# Patient Record
Sex: Male | Born: 1985 | Race: Black or African American | Hispanic: No | Marital: Married | Smoking: Never smoker
Health system: Southern US, Community
[De-identification: ages and names within clinical notes are randomized; demographics above are authoritative.]

## PROBLEM LIST (undated history)

## (undated) DIAGNOSIS — G43909 Migraine, unspecified, not intractable, without status migrainosus: Secondary | ICD-10-CM

## (undated) DIAGNOSIS — L739 Follicular disorder, unspecified: Secondary | ICD-10-CM

---
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
## 2007-05-15 ENCOUNTER — Emergency Department (HOSPITAL_COMMUNITY): Admission: EM | Admit: 2007-05-15 | Discharge: 2007-05-15 | Payer: Self-pay | Admitting: Emergency Medicine

## 2010-08-31 NOTE — H&P (Signed)
NAME:  Gerald Savage, Gerald Savage NO.:  [id number]   MEDICAL RECORD NO.:  [id number]                   PATIENT TYPE:  INP   LOCATION:  0451                                 FACILITY:  [facility]   PHYSICIAN:  Hannah Beat, M.D.                   DATE OF BIRTH:  Jun 01, 1985   DATE OF ADMISSION:  11/10/2002  DATE OF DISCHARGE:                                HISTORY & PHYSICAL   PRIMARY CARE PHYSICIAN:  Sharlot Gowda, M.D.   CHIEF COMPLAINT:  Weakness and shortness of breath.   HISTORY OF PRESENTING ILLNESS:  This is a 25 year old athletic male without  significant medical history who presented to the emergency department after  experiencing sudden shortness of breath while running during football  practice at approximately 8:30 p.m. this evening.  He had been lifting  weights around 5:30, and subsequently concluded football practice today with  a 400 meter run, at which time he states he felt as though he could not  catch his breath.  He felt like he had a cough and had a sharp focal pain in  his left parasternal region that was sharp.  It was nonradiating and lasted  for 30 minutes in duration and completely resolved.  He was found to be  orthostatic in the emergency department without any risk factors identified  for pulmonary emboli.  He denied any change in his general health; in fact  he had a football physical two days ago and said that everything was fine.  He has been active all his life in sports, and has never had any problems  such as this.  Denies any recent illnesses.  Denies increased urination.  No  complaints of joint pains.  No behavioral complaints.  His family states  that this is a single isolated episode and never happened like this before.  He was seen in the emergency department at [facility] completely pain free  and asymptomatic after receiving IV fluids.   PAST MEDICAL HISTORY:  As noted above is unremarkable.   SURGICAL HISTORY:  There is  none.   FAMILY HISTORY:  The patient's father is 54, alive and well, without medical  problems.  Mother is 72 with type 2 diabetes.  A history of grandparents  with coronary artery disease on his mother's side.  There are no reports of  sudden death or early heart disease, to the knowledge of the patient's  father.   SOCIAL HISTORY:  The patient plays football in school, which he has all his  life.  Denies tobacco.  Denies IV drugs, IV steroids, crack cocaine.  He  only takes B-12 supplements.   MEDICATION LIST:  B-12 supplements.   ALLERGIES:  No known drug allergies.   REVIEW OF SYSTEMS:  GENERAL:  The patient has been quite well.  No weight  loss.  His appetite is good.  He denies nausea, vomiting, diarrhea.  HEENT:  With this presenting illness, the patient stated he had some eye pain which  has resolved at present.  No visual disturbance, no auditory disturbance, no  hemoptysis or productive cough, no pharyngitis.  He denied neck pain.  CARDIOVASCULAR:  He maintains a regular exercise regimen.  Runs pretty  regularly.  Lifts weights.  He denies decrease in exercise tolerance up  until this episode today.  PULMONARY:  Denies cough, denies hemoptysis.  He  reports that he had some wheezing with his history of presenting illness.  GI:  No nausea, vomiting, or diarrhea as noted above.  No hematochezia, no  melena.  GU:  No dysuria, no polyuria.  ID:  The patient denies any recent  travel.  No recent upper respiratory infections.  No abscesses or dental  caries.  No known areas of infection.  MUSCULOSKELETAL:  The patient denies  joint pains, joint swelling.  Denies morning stiffness.  He denies rash.  NEUROLOGIC:  There are no behavioral problems, no change in his mental  status according to his father.   PHYSICAL EXAMINATION:  VITAL SIGNS:  The patient's blood pressure is 120/61,  heart rate 104, temperature 99.1, respirations 20.  GENERAL:  The patient is awake and alert, in no  acute distress.  He is  comfortable.  The patient exhibits no morphinoid features.  HEENT:  Normocephalic and atraumatic.  Extraocular muscles intact.  Pupils  equal, round and reactive to light and accommodation.  Throat is clear.  NECK:  Supple, nontender.  CARDIOVASCULAR:  S1 and S2 without murmur or S3.  There was no jugular  venous distention, no carotid bruits to auscultation.  His peripheral pulses  including radial, femoral, dorsalis pedis pulse, and popliteal are  symmetrical bilaterally.  PULMONARY:  Clear to auscultation bilaterally.  No wheeze noted.  Symmetrical excursion bilaterally.  No dullness to percussion over the lung  fields.  ABDOMEN:  Soft and nontender without organomegaly.  No palpable tenderness.  No rigidity.  Bowel sounds are normoactive.  EXTREMITIES:  No clubbing, cyanosis, or edema.  No splinter hemorrhages.  His strength is +5/5 bilaterally with respect to his biceps, triceps, hip  flexors and extensors.  Deep tendon reflexes are +2/4 with respect to the  patella, brachial radialis, and biceps.   LABORATORY DATA:  The patient had a WBC of 16.8, hemoglobin 16.1, hematocrit  45, platelets 259.  Sodium 138, potassium 3.9, chloride 104, CO2 25, BUN 10,  creatinine 1.8, glucose 96, calcium 11.0.  AST and ALT 31 and 13, alkaline  phosphatase 89, bilirubin 1.1.  Urine drug screen is negative.  EKG reveals  sinus tachycardia with no evidence of ST segment elevation or Q waves.  There was early repolarization.  Chest x-ray was reported by Dr. Lynelle Doctor, as  well as the radiologist, as no active disease.   IMPRESSION AND PLAN:  1. Orthostasis/dehydration.  This fits the clinical picture with the     patient's activities as reported in the history of present illness.     Currently asymptomatic.  He is being volume repleted with lytes.  We are     going to repeat his Chem-7 in 12 hours to observe for resolution of his    creatinine.  We will check ionized calcium to  due to the calcium being     mildly elevated at 11.0, possibly due to the patient's volume depletion.     We will continue to administer IV fluids.  Secondary to the orthostasis  and its acute nature, we will check an echocardiogram to rule out any     valvular abnormalities.  2. Elevated creatinine.  We are going to check a urine sodium.  Will check     his FENA and continue hydration, and repeat this lab in the morning.  I     felt that this may be secondary to his volume depletion and possibly     secondary     to recent strenuous activities.  3. Elevated WBC.  We will repeat this in the a.m., and as the patient has a     low-grade temperature, cultures had been obtained in the emergency     department.  Will await these result, and will follow him clinically.                                               Hannah Beat, M.D.    ES/MEDQ  D:  11/11/2002  T:  11/11/2002  Job:  [id number]   cc:   Sharlot Gowda, M.D.  [address] [place], [place] [address]  Fax: [phone]

## 2011-06-20 ENCOUNTER — Encounter: Payer: Self-pay | Admitting: Internal Medicine

## 2011-06-21 ENCOUNTER — Emergency Department (HOSPITAL_COMMUNITY): Payer: 59

## 2011-06-21 ENCOUNTER — Encounter (HOSPITAL_COMMUNITY): Payer: Self-pay | Admitting: Emergency Medicine

## 2011-06-21 ENCOUNTER — Emergency Department (HOSPITAL_COMMUNITY)
Admission: EM | Admit: 2011-06-21 | Discharge: 2011-06-22 | Disposition: A | Discharge status: Home / Self Care | Payer: 59 | Attending: Emergency Medicine | Admitting: Emergency Medicine

## 2011-06-21 DIAGNOSIS — B349 Viral infection, unspecified: Secondary | ICD-10-CM

## 2011-06-21 DIAGNOSIS — R05 Cough: Secondary | ICD-10-CM | POA: Insufficient documentation

## 2011-06-21 DIAGNOSIS — R059 Cough, unspecified: Secondary | ICD-10-CM | POA: Insufficient documentation

## 2011-06-21 DIAGNOSIS — R509 Fever, unspecified: Secondary | ICD-10-CM | POA: Insufficient documentation

## 2011-06-21 DIAGNOSIS — B9789 Other viral agents as the cause of diseases classified elsewhere: Secondary | ICD-10-CM | POA: Insufficient documentation

## 2011-06-21 DIAGNOSIS — R110 Nausea: Secondary | ICD-10-CM | POA: Insufficient documentation

## 2011-06-21 HISTORY — DX: Follicular disorder, unspecified: L73.9

## 2011-06-21 HISTORY — DX: Migraine, unspecified, not intractable, without status migrainosus: G43.909

## 2011-06-21 MED ORDER — ACETAMINOPHEN 325 MG PO TABS
650.0000 mg | ORAL_TABLET | Freq: Once | ORAL | Status: AC
  Administered 2011-06-21: 650 mg via ORAL
  Filled 2011-06-21: qty 2

## 2011-06-21 NOTE — ED Provider Notes (Signed)
History     CSN: [id number]  Arrival date & time 06/21/11  2014   First MD Initiated Contact with Patient 06/21/11 2244      Chief Complaint  Patient presents with  . Fever    (Consider location/radiation/quality/duration/timing/severity/associated sxs/prior treatment) Patient is a 26 y.o. male presenting with fever. The history is provided by the patient. No language interpreter was used.  Fever Primary symptoms of the febrile illness include fever, cough and nausea. Primary symptoms do not include fatigue, visual change, headaches, wheezing, shortness of breath, abdominal pain, vomiting, diarrhea or dysuria. The problem has been resolved. Primary symptoms comment: sore throat   Upper respiratory symptoms with fever x 24 hours.  Headache, Cough with congestion, sore throat and body aches. No neck pain.    Past Medical History  Diagnosis Date  . Folliculitis   . Migraines     History reviewed. No pertinent past surgical history.  Family History  Problem Relation Age of Onset  . Diabetes Other     History  Substance Use Topics  . Smoking status: Never Smoker   . Smokeless tobacco: Not on file  . Alcohol Use: Yes     rare      Review of Systems  Constitutional: Positive for fever. Negative for fatigue.  Respiratory: Positive for cough. Negative for shortness of breath and wheezing.   Gastrointestinal: Positive for nausea. Negative for vomiting, abdominal pain and diarrhea.  Genitourinary: Negative for dysuria.  Neurological: Negative for headaches.  All other systems reviewed and are negative.    Allergies  Review of patient's allergies indicates no known allergies.  Home Medications   Current Outpatient Rx  Name Route Sig Dispense Refill  . DM-GUAIFENESIN ER 30-600 MG PO TB12 Oral Take 1 tablet by mouth every 12 (twelve) hours.    . IBUPROFEN 200 MG PO TABS Oral Take 200 mg by mouth every 6 (six) hours as needed. For pain relief    . ISOTRETINOIN 40 MG PO  CAPS Oral Take 40 mg by mouth 2 (two) times daily.      BP 140/83  Pulse 116  Temp(Src) 100.2 F (37.9 C) (Oral)  Resp 18  SpO2 97%  Physical Exam  Nursing note and vitals reviewed. Constitutional: He is oriented to person, place, and time. He appears well-developed and well-nourished.  HENT:  Head: Normocephalic and atraumatic.  Eyes: Pupils are equal, round, and reactive to light.  Neck: Normal range of motion. Neck supple.  Cardiovascular: Normal rate and regular rhythm.   Pulmonary/Chest: Effort normal and breath sounds normal. No respiratory distress.  Abdominal: Soft. He exhibits no distension.  Musculoskeletal: Normal range of motion.  Neurological: He is alert and oriented to person, place, and time. No cranial nerve deficit.  Skin: Skin is warm and dry.  Psychiatric: He has a normal mood and affect.    ED Course  Procedures (including critical care time)  Labs Reviewed - No data to display No results found.   No diagnosis found.    MDM  Fever, cough, sore throat, headache and body aches.  Fever 100.2 after tylenol. No neck pain, dysuria or vomiting.  Nauseated with headache earlier has resolved.  Chest x-ray and strep negative.  Will return if worse with neck pain. Tylenol or motrin for fever.  Zofran for nausea.  pmh of migraines and folliculitis.       Jethro Bastos, NP 06/22/11 1141

## 2011-06-21 NOTE — ED Notes (Signed)
Pt states he has had a headache all night, congestion in his chest, chills, vomiting, sore throat, cough, body aches  Sxs started last night

## 2011-06-22 MED ORDER — IBUPROFEN 800 MG PO TABS
800.0000 mg | ORAL_TABLET | Freq: Once | ORAL | Status: AC
  Administered 2011-06-22: 800 mg via ORAL
  Filled 2011-06-22: qty 1

## 2011-06-22 NOTE — ED Provider Notes (Signed)
Medical screening examination/treatment/procedure(s) were performed by non-physician practitioner and as supervising physician I was immediately available for consultation/collaboration.   Isay Perleberg L Davionne Dowty, MD 06/22/11 2234 

## 2011-06-22 NOTE — Discharge Instructions (Signed)
Gerald Savage you do not have strep throat or pneumonia as a source of your fever.  I think you are experiencing a febrile virus.  If you get worse with neck pain or burning with urination return to the ER.  Use the zofran for nausea if you need to.  Take tylenol every 4 hours for fever.  You can also take ibuprofen for fever if you wish.     Antibiotic Nonuse  Your caregiver felt that the infection or problem was not one that would be helped with an antibiotic. Infections may be caused by viruses or bacteria. Only a caregiver can tell which one of these is the likely cause of an illness. A cold is the most common cause of infection in both adults and children. A cold is a virus. Antibiotic treatment will have no effect on a viral infection. Viruses can lead to many lost days of work caring for sick children and many missed days of school. Children may catch as many as 10 "colds" or "flus" per year during which they can be tearful, cranky, and uncomfortable. The goal of treating a virus is aimed at keeping the ill person comfortable. Antibiotics are medications used to help the body fight bacterial infections. There are relatively few types of bacteria that cause infections but there are hundreds of viruses. While both viruses and bacteria cause infection they are very different types of germs. A viral infection will typically go away by itself within 7 to 10 days. Bacterial infections may spread or get worse without antibiotic treatment. Examples of bacterial infections are:  Sore throats (like strep throat or tonsillitis).   Infection in the lung (pneumonia).   Ear and skin infections.  Examples of viral infections are:  Colds or flus.   Most coughs and bronchitis.   Sore throats not caused by Strep.   Runny noses.  It is often best not to take an antibiotic when a viral infection is the cause of the problem. Antibiotics can kill off the helpful bacteria that we have inside our body and allow  harmful bacteria to start growing. Antibiotics can cause side effects such as allergies, nausea, and diarrhea without helping to improve the symptoms of the viral infection. Additionally, repeated uses of antibiotics can cause bacteria inside of our body to become resistant. That resistance can be passed onto harmful bacterial. The next time you have an infection it may be harder to treat if antibiotics are used when they are not needed. Not treating with antibiotics allows our own immune system to develop and take care of infections more efficiently. Also, antibiotics will work better for [place] when they are prescribed for bacterial infections. Treatments for a child that is ill may include:  Give extra fluids throughout the day to stay hydrated.   Get plenty of rest.   Only give your child over-the-counter or prescription medicines for pain, discomfort, or fever as directed by your caregiver.   The use of a cool mist humidifier may help stuffy noses.   Cold medications if suggested by your caregiver.  Your caregiver may decide to start you on an antibiotic if:  The problem you were seen for today continues for a longer length of time than expected.   You develop a secondary bacterial infection.  SEEK MEDICAL CARE IF:  Fever lasts longer than 5 days.   Symptoms continue to get worse after 5 to 7 days or become severe.   Difficulty in breathing develops.   Signs  of dehydration develop (poor drinking, rare urinating, dark colored urine).   Changes in behavior or worsening tiredness (listlessness or lethargy).  Document Released: 06/10/2001 Document Revised: 03/21/2011 Document Reviewed: 12/07/2008 [facility] Patient Information 2012 [place], [place].Cough, Adult  A cough is a reflex. It helps you clear your throat and airways. A cough can help heal your body. A cough can last 2 or 3 weeks (acute) or may last more than 8 weeks (chronic). Some common causes of a cough can include an infection,  allergy, or a cold. HOME CARE  Only take medicine as told by your doctor.   If given, take your medicines (antibiotics) as told. Finish them even if you start to feel better.   Use a cold steam vaporizer or humidier in your home. This can help loosen thick spit (secretions).   Sleep so you are almost sitting up (semi-upright). Use pillows to do this. This helps reduce coughing.   Rest as needed.   Stop smoking if you smoke.  GET HELP RIGHT AWAY IF:  You have yellowish-white fluid (pus) in your thick spit.   Your cough gets worse.   Your medicine does not reduce coughing, and you are losing sleep.   You cough up blood.   You have trouble breathing.   Your pain gets worse and medicine does not help.   You have a fever.  MAKE SURE YOU:   Understand these instructions.   Will watch your condition.   Will get help right away if you are not doing well or get worse.  Document Released: 12/13/2010 Document Revised: 03/21/2011 Document Reviewed: 12/13/2010 [facility] Patient Information 2012 [place], [place].Cough, Adult  A cough is a reflex. It helps you clear your throat and airways. A cough can help heal your body. A cough can last 2 or 3 weeks (acute) or may last more than 8 weeks (chronic). Some common causes of a cough can include an infection, allergy, or a cold. HOME CARE  Only take medicine as told by your doctor.   If given, take your medicines (antibiotics) as told. Finish them even if you start to feel better.   Use a cold steam vaporizer or humidier in your home. This can help loosen thick spit (secretions).   Sleep so you are almost sitting up (semi-upright). Use pillows to do this. This helps reduce coughing.   Rest as needed.   Stop smoking if you smoke.  GET HELP RIGHT AWAY IF:  You have yellowish-white fluid (pus) in your thick spit.   Your cough gets worse.   Your medicine does not reduce coughing, and you are losing sleep.   You cough up blood.    You have trouble breathing.   Your pain gets worse and medicine does not help.   You have a fever.  MAKE SURE YOU:   Understand these instructions.   Will watch your condition.   Will get help right away if you are not doing well or get worse.  Document Released: 12/13/2010 Document Revised: 03/21/2011 Document Reviewed: 12/13/2010 [facility] Patient Information 2012 [place], [place].

## 2011-06-26 ENCOUNTER — Encounter: Payer: Self-pay | Admitting: Family Medicine

## 2011-06-26 ENCOUNTER — Ambulatory Visit (INDEPENDENT_AMBULATORY_CARE_PROVIDER_SITE_OTHER): Payer: 59 | Admitting: Family Medicine

## 2011-06-26 VITALS — BP 132/90 | HR 78 | Ht 74.0 in | Wt 331.0 lb

## 2011-06-26 DIAGNOSIS — Z0000 Encounter for general adult medical examination without abnormal findings: Secondary | ICD-10-CM

## 2011-06-26 DIAGNOSIS — B350 Tinea barbae and tinea capitis: Secondary | ICD-10-CM

## 2011-06-26 DIAGNOSIS — G44009 Cluster headache syndrome, unspecified, not intractable: Secondary | ICD-10-CM | POA: Insufficient documentation

## 2011-06-26 DIAGNOSIS — L738 Other specified follicular disorders: Secondary | ICD-10-CM

## 2011-06-26 LAB — POCT URINALYSIS DIPSTICK
Blood, UA: NEGATIVE
Ketones, UA: NEGATIVE
Spec Grav, UA: 1.015
Urobilinogen, UA: NEGATIVE
pH, UA: 8

## 2011-06-26 LAB — COMPREHENSIVE METABOLIC PANEL
ALT: 18 U/L (ref 0–53)
AST: 23 U/L (ref 0–37)
BUN: 10 mg/dL (ref 6–23)
CO2: 27 mEq/L (ref 19–32)
Calcium: 9.8 mg/dL (ref 8.4–10.5)
Chloride: 103 mEq/L (ref 96–112)
Creat: 0.8 mg/dL (ref 0.50–1.35)
Total Bilirubin: 0.5 mg/dL (ref 0.3–1.2)

## 2011-06-26 LAB — LIPID PANEL
Cholesterol: 128 mg/dL (ref 0–200)
HDL: 41 mg/dL (ref >39)
Total CHOL/HDL Ratio: 3.1 Ratio
VLDL: 11 mg/dL (ref 0–40)

## 2011-06-26 NOTE — Progress Notes (Signed)
Subjective:    Patient ID: Gerald Savage, male    DOB: 1986-03-25, 26 y.o.   MRN: [id number]  HPI He is here for a complete examination. He is now working full time. 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 He is now started an exercise program. He plans to lose approximately 50 pounds. Presently he is on Accutane for treatment of folliculitis barbae and is being followed by a dermatologist. He also has a history of every fall having right-sided headaches with associated tearing and right for head sweating this usually lasts approximately 2 weeks. He has not ever tried any medications. He has no other concerns or complaints.   Review of Systems  Constitutional: Negative.   Eyes: Negative.   Respiratory: Negative.   Cardiovascular: Negative.   Gastrointestinal: Negative.   Musculoskeletal: Negative.   Skin: Negative.   Neurological: Positive for headaches.  Hematological: Negative.   Psychiatric/Behavioral: Negative.        Objective:   Physical Exam BP 132/90  Pulse 78  Ht 6\' 2"  (1.88 m)  Wt 331 lb (150.141 kg)  BMI 42.50 kg/m2  General Appearance:    Alert, cooperative, no distress, appears stated age  Head:    Normocephalic, without obvious abnormality, atraumatic  Eyes:    PERRL, conjunctiva/corneas clear, EOM's intact, fundi    benign  Ears:    Normal TM's and external ear canals  Nose:   Nares normal, mucosa normal, no drainage or sinus   tenderness  Throat:   Lips, mucosa, and tongue normal; teeth and gums normal  Neck:   Supple, no lymphadenopathy;  thyroid:  no   enlargement/tenderness/nodules; no carotid   bruit or JVD  Back:    Spine nontender, no curvature, ROM normal, no CVA     tenderness  Lungs:     Clear to auscultation bilaterally without wheezes, rales or     ronchi; respirations unlabored  Chest Wall:    No tenderness or deformity   Heart:    Regular rate and rhythm, S1 and S2 normal, no murmur, rub   or gallop  Breast Exam:    No chest  wall tenderness, masses or gynecomastia  Abdomen:     Soft, non-tender, nondistended, normoactive bowel sounds,    no masses, no hepatosplenomegaly  Genitalia:    Normal male external genitalia without lesions.  Testicles without masses and slight asymmetry.  No inguinal hernias.  Rectal:   Deferred due to age <40 and lack of symptoms  Extremities:   No clubbing, cyanosis or edema  Pulses:   2+ and symmetric all extremities  Skin:   Skin color, texture, turgor normal, no rashes or lesions  Lymph nodes:   Cervical, supraclavicular, and axillary nodes normal  Neurologic:   CNII-XII intact, normal strength, sensation and gait; reflexes 2+ and symmetric throughout          Psych:   Normal mood, affect, hygiene and grooming.           Assessment & Plan:   1. Physical exam, annual  POCT Urinalysis Dipstick, CBC with Differential, Comprehensive metabolic panel, Lipid panel  2. Cluster headaches    3. Folliculitis barbae    4. Obesity, Class III, BMI 40-49.9 (morbid obesity)  CBC with Differential, Comprehensive metabolic panel, Lipid panel   discussed the need for him to make dietary changes especially in regard to carbohydrates and continue to exercise regularly. Also asked him to call me when the headaches recur for placement  on preventive medication.

## 2011-06-26 NOTE — Patient Instructions (Signed)
Call me the next time you're headaches occur and come in. There are many medications we can use to help this. Keep working on your weight reduction and remember to cut back on white food.

## 2011-06-27 LAB — CBC WITH DIFFERENTIAL/PLATELET
Basophils Absolute: 0 10*3/uL (ref 0.0–0.1)
Eosinophils Absolute: 0 10*3/uL (ref 0.0–0.7)
Eosinophils Relative: 1 % (ref 0–5)
HCT: 44.7 % (ref 39.0–52.0)
Lymphocytes Relative: 52 % — ABNORMAL HIGH (ref 12–46)
MCH: 29.7 pg (ref 26.0–34.0)
MCV: 87.3 fL (ref 78.0–100.0)
Monocytes Absolute: 0.4 10*3/uL (ref 0.1–1.0)
Platelets: 289 10*3/uL (ref 150–400)
RDW: 12.9 % (ref 11.5–15.5)

## 2011-06-29 NOTE — Progress Notes (Signed)
Quick Note:  The blood work is normal ______ 

## 2016-11-18 ENCOUNTER — Telehealth: Payer: Self-pay | Admitting: Family Medicine

## 2016-11-18 NOTE — Telephone Encounter (Signed)
Pt called and is requesting a TDAP shot he is having a child in sept, and they need him to have this, he has not been here since 2013, and would need to be a new pt, he states he just needs the shot, but would be be ok with coming in for a get re established if needed, pt has uhc, pt can be reached at [phone]

## 2016-11-18 NOTE — Telephone Encounter (Signed)
Schedule him for a 15 minute appointment and we can get the TDaP

## 2016-11-19 NOTE — Telephone Encounter (Signed)
Left word for word message on VM 

## 2016-12-04 ENCOUNTER — Encounter: Payer: Self-pay | Admitting: Family Medicine

## 2016-12-04 ENCOUNTER — Ambulatory Visit (INDEPENDENT_AMBULATORY_CARE_PROVIDER_SITE_OTHER): Payer: 59 | Admitting: Family Medicine

## 2016-12-04 VITALS — BP 136/84 | HR 78 | Ht 75.5 in | Wt 350.0 lb

## 2016-12-04 DIAGNOSIS — Z23 Encounter for immunization: Secondary | ICD-10-CM | POA: Diagnosis not present

## 2016-12-04 NOTE — Patient Instructions (Signed)
20 minutes of something physical every day or equivalent to that on a weekly basis. Cut back on "white food'

## 2016-12-04 NOTE — Progress Notes (Signed)
   Subjective:    Patient ID: Gerald Savage, male    DOB: June 04, 1985, 31 y.o.   MRN: [id number]  HPI He is here to get reestablished as the patient. He was last seen in 2013. He is now married and has a child on the way and is in need of an immunization update. He also has decided to make major lifestyle changes to help with his weight. He did play football in college and is interested in swimming down.   Review of Systems     Objective:   Physical Exam alert and in no distress otherwise not examined      Assessment & Plan:  Obesity, Class III, BMI 40-49.9 (morbid obesity) (HCC)  Need for Tdap vaccination - Plan: Tdap vaccine greater than or equal to 7yo IM  Need for influenza vaccination - Plan: Flu Vaccine QUAD 6+ mos PF IM (Fluarix Quad PF) His immunizations were updated. I discussed childrearing with him in regard to the first 18 months. Discussed general issues with childrearing. I then discussed diet and exercise with him. Recommended at least 20 minutes of something physical on a daily basis or heart 50 minutes per week. Also discussed cutting back on carbohydrates. Recommended he set a goal waist size red than weight and as he shrinks, get rid of the large clothes. Recommend he return here for complete examination.

## 2017-02-13 ENCOUNTER — Ambulatory Visit (INDEPENDENT_AMBULATORY_CARE_PROVIDER_SITE_OTHER): Payer: 59 | Admitting: Family Medicine

## 2017-02-13 ENCOUNTER — Encounter: Payer: Self-pay | Admitting: Family Medicine

## 2017-02-13 VITALS — BP 132/82 | HR 96 | Ht 74.5 in | Wt 342.8 lb

## 2017-02-13 DIAGNOSIS — L738 Other specified follicular disorders: Secondary | ICD-10-CM

## 2017-02-13 DIAGNOSIS — Z0000 Encounter for general adult medical examination without abnormal findings: Secondary | ICD-10-CM | POA: Diagnosis not present

## 2017-02-13 DIAGNOSIS — F988 Other specified behavioral and emotional disorders with onset usually occurring in childhood and adolescence: Secondary | ICD-10-CM

## 2017-02-13 DIAGNOSIS — G44019 Episodic cluster headache, not intractable: Secondary | ICD-10-CM

## 2017-02-13 LAB — CBC WITH DIFFERENTIAL/PLATELET
BASOS ABS: 31 {cells}/uL (ref 0–200)
Basophils Relative: 0.6 %
EOS ABS: 78 {cells}/uL (ref 15–500)
Eosinophils Relative: 1.5 %
HCT: 42.3 % (ref 38.5–50.0)
Hemoglobin: 14.6 g/dL (ref 13.2–17.1)
Lymphs Abs: 1768 cells/uL (ref 850–3900)
MCH: 28.9 pg (ref 27.0–33.0)
MCHC: 34.5 g/dL (ref 32.0–36.0)
MCV: 83.8 fL (ref 80.0–100.0)
MPV: 10.6 fL (ref 7.5–12.5)
Monocytes Relative: 5.7 %
NEUTROS PCT: 58.2 %
Neutro Abs: 3026 cells/uL (ref 1500–7800)
PLATELETS: 322 10*3/uL (ref 140–400)
RBC: 5.05 10*6/uL (ref 4.20–5.80)
RDW: 13 % (ref 11.0–15.0)
TOTAL LYMPHOCYTE: 34 %
WBC: 5.2 10*3/uL (ref 3.8–10.8)
WBCMIX: 296 {cells}/uL (ref 200–950)

## 2017-02-13 LAB — POCT URINALYSIS DIP (PROADVANTAGE DEVICE)
BILIRUBIN UA: NEGATIVE
Blood, UA: NEGATIVE
GLUCOSE UA: NEGATIVE mg/dL
Ketones, POC UA: NEGATIVE mg/dL
Leukocytes, UA: NEGATIVE
NITRITE UA: NEGATIVE
Specific Gravity, Urine: 1.02
UUROB: NEGATIVE
pH, UA: 6 (ref 5.0–8.0)

## 2017-02-13 LAB — COMPREHENSIVE METABOLIC PANEL
AG Ratio: 1.2 (calc) (ref 1.0–2.5)
ALBUMIN MSPROF: 4 g/dL (ref 3.6–5.1)
ALKALINE PHOSPHATASE (APISO): 71 U/L (ref 40–115)
ALT: 25 U/L (ref 9–46)
AST: 24 U/L (ref 10–40)
BUN: 10 mg/dL (ref 7–25)
CHLORIDE: 104 mmol/L (ref 98–110)
CO2: 24 mmol/L (ref 20–32)
CREATININE: 0.77 mg/dL (ref 0.60–1.35)
Calcium: 9.3 mg/dL (ref 8.6–10.3)
GLOBULIN: 3.4 g/dL (ref 1.9–3.7)
Glucose, Bld: 96 mg/dL (ref 65–99)
POTASSIUM: 4 mmol/L (ref 3.5–5.3)
Sodium: 136 mmol/L (ref 135–146)
Total Bilirubin: 0.6 mg/dL (ref 0.2–1.2)
Total Protein: 7.4 g/dL (ref 6.1–8.1)

## 2017-02-13 LAB — LIPID PANEL
CHOL/HDL RATIO: 2.5 (calc) (ref <5.0)
CHOLESTEROL: 127 mg/dL (ref <200)
HDL: 51 mg/dL (ref >40)
LDL Cholesterol (Calc): 64 mg/dL (calc)
Non-HDL Cholesterol (Calc): 76 mg/dL (calc) (ref <130)
Triglycerides: 50 mg/dL (ref <150)

## 2017-02-13 MED ORDER — AMPHETAMINE-DEXTROAMPHETAMINE 10 MG PO TABS: 10.0000 mg | ORAL_TABLET | Freq: Two times a day (BID) | ORAL | 0 refills | Status: DC

## 2017-02-13 NOTE — Patient Instructions (Signed)
I will need to know if it works, how long it works and if you have any trouble. Try today

## 2017-02-13 NOTE — Progress Notes (Signed)
Subjective:    Patient ID: Gerald Savage, male    DOB: 09/06/1985, 31 y.o.   MRN: [id number]  HPI He is here for a complete examination. He has lost several pounds since his last visit and has made some diet and exercise changes. He is apparently exercising 3 days per week. He is now a new dad. He does have a history of cluster headaches but has not had difficulty with that in quite some time. He also has a previous history of folliculitis barbae and in the past had been on an Accutane product. He did have difficulty with medication causing side effects and stopped but did note an improvement in his folliculitis. He also has concerns over his focus. He states that he has had difficulty even in grade school as well as high school but was able to work around it. Now that he is working he notes difficulty with focus as well as organization and being easily distracted. Work and home life are going well. His daughter is less 31 year old. Family and social history as well as health maintenance and immunizations was reviewed.   Review of Systems  All other systems reviewed and are negative.      Objective:   Physical Exam BP 132/82   Pulse 96   Ht 6' 2.5" (1.892 m)   Wt (!) 342 lb 12.8 oz (155.5 kg)   BMI 43.42 kg/m   General Appearance:    Alert, cooperative, no distress, appears stated age  Head:    Normocephalic, without obvious abnormality, atraumatic. multiple raised papular lesions are noted on the scalp especially in the parietal occipital area.   Eyes:    PERRL, conjunctiva/corneas clear, EOM's intact, fundi    benign  Ears:    Normal TM's and external ear canals  Nose:   Nares normal, mucosa normal, no drainage or sinus   tenderness  Throat:   Lips, mucosa, and tongue normal; teeth and gums normal  Neck:   Supple, no lymphadenopathy;  thyroid:  no   enlargement/tenderness/nodules; no carotid   bruit or JVD     Lungs:     Clear to auscultation bilaterally without wheezes,  rales or     ronchi; respirations unlabored      Heart:    Regular rate and rhythm, S1 and S2 normal, no murmur, rub   or gallop     Abdomen:     Soft, non-tender, nondistended, normoactive bowel sounds,    no masses, no hepatosplenomegaly  Genitalia:    Normal male external genitalia without lesions.  Testicles without masses.  No inguinal hernias.  Rectal:   Deferred due to age <40 and lack of symptoms  Extremities:   No clubbing, cyanosis or edema  Pulses:   2+ and symmetric all extremities  Skin:   Skin color, texture, turgor normal, no rashes or lesions  Lymph nodes:   Cervical, supraclavicular, and axillary nodes normal  Neurologic:   CNII-XII intact, normal strength, sensation and gait; reflexes 2+ and symmetric throughout          Psych:   Normal mood, affect, hygiene and grooming.    ADD questionnaire score of 36.      Assessment & Plan:  Routine general medical examination at a health care facility - Plan: CBC with Differential/Platelet, Comprehensive metabolic panel, Lipid panel, POCT Urinalysis DIP (Proadvantage Device)  Obesity, Class III, BMI 40-49.9 (morbid obesity) (HCC)  Episodic cluster headache, not intractable  Folliculitis barbae - Plan: Ambulatory referral  to Dermatology  Attention deficit disorder (ADD) without hyperactivity - Plan: amphetamine-dextroamphetamine (ADDERALL) 10 MG tablet Encouraged him to continue with his diet and exercise regimen. He will call if he has difficulty with headaches. I will also send him to dermatology for further evaluation and treatment of his folliculitis. Discussed ADD with hi in regard to starting him on medication. Adderall 10 mg written. He is to let me know if it works, how long and if he has any side effects. I will get him back in several weeks to further discuss the therapy and

## 2017-02-17 ENCOUNTER — Telehealth: Payer: Self-pay | Admitting: Internal Medicine

## 2017-02-17 NOTE — Telephone Encounter (Signed)
Pt called to let us know that his adderall is only lasting for about and hour and half. He his bottle only say take it once a day but in system it says twice a day. Please advise the correct dosing and what he needs to do.

## 2017-02-17 NOTE — Telephone Encounter (Signed)
Have him double the dose for a couple of days and then call back and let us know how he is doing

## 2017-02-17 NOTE — Telephone Encounter (Signed)
Pt aware.he will call us back on Thursday. Trixie Rude/RLB

## 2017-02-17 NOTE — Telephone Encounter (Signed)
error    This encounter was created in error - please disregard.

## 2017-02-20 ENCOUNTER — Telehealth: Payer: Self-pay | Admitting: Family Medicine

## 2017-02-20 NOTE — Telephone Encounter (Signed)
Pt states he went to pick up new medication 02/13/17 & had to pay cash.  States he has never tried any other meds, and was not diagnosed as a child.  [facility] t# [phone] ID# [id number]. Called [facility] & optained approval from 01/21/17 til 11/8/2.  Was able to get them to back date til last month.  Left message for pt

## 2017-02-27 ENCOUNTER — Ambulatory Visit (INDEPENDENT_AMBULATORY_CARE_PROVIDER_SITE_OTHER): Payer: 59 | Admitting: Family Medicine

## 2017-02-27 ENCOUNTER — Encounter: Payer: Self-pay | Admitting: Family Medicine

## 2017-02-27 VITALS — BP 128/80 | HR 88 | Resp 18 | Wt 338.6 lb

## 2017-02-27 DIAGNOSIS — F988 Other specified behavioral and emotional disorders with onset usually occurring in childhood and adolescence: Secondary | ICD-10-CM

## 2017-02-27 NOTE — Progress Notes (Signed)
   Subjective:    Patient ID: Gerald Savage, male    DOB: 02/11/1986, 31 y.o.   MRN: [id number]  HPI He is here for recheck.  He notes that the Adderall 10 mg does work to help him stay focused but only lasts roughly 2 hours.  He has tried twice per day but again 2 hours is the max.  He is very happy with this. Also of note is the fact that he has continued to lose weight.   Review of Systems     Objective:   Physical Exam Alert and in no distress otherwise not examined       Assessment & Plan:  Attention deficit disorder (ADD) without hyperactivity I have asked him to increase his dosing to 15 mg to see if he gets more focus and let me know.  Depending upon how he responds I will probably switch him to Vyvanse as I doubt he will be able to get enough benefit out of going to the Adderall XR.

## 2017-03-03 ENCOUNTER — Telehealth: Payer: Self-pay | Admitting: Family Medicine

## 2017-03-03 ENCOUNTER — Telehealth: Payer: Self-pay

## 2017-03-03 MED ORDER — AMPHETAMINE-DEXTROAMPHETAMINE 20 MG PO TABS: 20.0000 mg | ORAL_TABLET | Freq: Every day | ORAL | 0 refills | Status: DC

## 2017-03-03 NOTE — Telephone Encounter (Signed)
Pt called out of Adderall.   Needs stronger dose.  The 1 1/2 pill not strong enough  Pt ph [phone]

## 2017-03-03 NOTE — Telephone Encounter (Signed)
Pt aware script Adderall 20mg  placed at front desk. Trixie Rude/RLB

## 2017-04-01 ENCOUNTER — Telehealth: Payer: Self-pay | Admitting: Family Medicine

## 2017-04-01 MED ORDER — AMPHETAMINE-DEXTROAMPHETAMINE 20 MG PO TABS: 20.0000 mg | ORAL_TABLET | Freq: Two times a day (BID) | ORAL | 0 refills | Status: DC

## 2017-04-01 NOTE — Addendum Note (Signed)
Addended by: Sharlot GowdaLALONDE, Raela Bohl C on: 04/01/2017 12:12 PM   Modules accepted: Orders

## 2017-04-01 NOTE — Telephone Encounter (Signed)
Pt wants refill of Adderall.  He also wants to increase to 1 tab twice daily as the rx is not lasting Please advise pt [phone]    [facility]

## 2017-04-01 NOTE — Telephone Encounter (Signed)
Let him know that I called the twice a day dosing and then have him let me know in a couple of weeks how he is doing.

## 2017-04-02 NOTE — Telephone Encounter (Signed)
Spoke with pt- he was made aware.  He states he will call in a few weeks to f/u on medication.

## 2017-05-14 ENCOUNTER — Telehealth: Payer: Self-pay

## 2017-05-14 MED ORDER — AMPHETAMINE-DEXTROAMPHETAMINE 20 MG PO TABS: 20.0000 mg | ORAL_TABLET | Freq: Two times a day (BID) | ORAL | 0 refills | Status: DC

## 2017-05-14 NOTE — Telephone Encounter (Signed)
Pt is requesting a refill for Adderrall. [facility] [address] . Thanks [facility]

## 2017-06-02 ENCOUNTER — Emergency Department (HOSPITAL_COMMUNITY)
Admission: EM | Admit: 2017-06-02 | Discharge: 2017-06-03 | Disposition: A | Discharge status: Home / Self Care | Payer: Self-pay | Attending: Emergency Medicine | Admitting: Emergency Medicine

## 2017-06-02 ENCOUNTER — Encounter (HOSPITAL_COMMUNITY): Payer: Self-pay

## 2017-06-02 ENCOUNTER — Emergency Department (HOSPITAL_COMMUNITY): Payer: Self-pay

## 2017-06-02 DIAGNOSIS — Z5321 Procedure and treatment not carried out due to patient leaving prior to being seen by health care provider: Secondary | ICD-10-CM | POA: Insufficient documentation

## 2017-06-02 LAB — CBC
HCT: 41.3 % (ref 39.0–52.0)
Hemoglobin: 14.2 g/dL (ref 13.0–17.0)
MCH: 29.7 pg (ref 26.0–34.0)
MCHC: 34.4 g/dL (ref 30.0–36.0)
MCV: 86.4 fL (ref 78.0–100.0)
Platelets: 329 10*3/uL (ref 150–400)
RBC: 4.78 MIL/uL (ref 4.22–5.81)
RDW: 13.3 % (ref 11.5–15.5)
WBC: 6 10*3/uL (ref 4.0–10.5)

## 2017-06-02 LAB — BASIC METABOLIC PANEL
Anion gap: 10 (ref 5–15)
BUN: 5 mg/dL — AB (ref 6–20)
CHLORIDE: 104 mmol/L (ref 101–111)
CO2: 24 mmol/L (ref 22–32)
Calcium: 9.1 mg/dL (ref 8.9–10.3)
Creatinine, Ser: 0.83 mg/dL (ref 0.61–1.24)
GFR calc Af Amer: >60 mL/min (ref >60)
GFR calc non Af Amer: >60 mL/min (ref >60)
GLUCOSE: 114 mg/dL — AB (ref 65–99)
POTASSIUM: 3.5 mmol/L (ref 3.5–5.1)
Sodium: 138 mmol/L (ref 135–145)

## 2017-06-02 LAB — I-STAT TROPONIN, ED: Troponin i, poc: 0 ng/mL (ref 0.00–0.08)

## 2017-06-02 NOTE — ED Triage Notes (Signed)
Pt states that for the past year he has been having chest tightness that comes and goes. No pain now, but had some earlier today, denies other cardiac symptoms

## 2017-06-03 ENCOUNTER — Ambulatory Visit (INDEPENDENT_AMBULATORY_CARE_PROVIDER_SITE_OTHER): Payer: Commercial Managed Care - PPO | Admitting: Family Medicine

## 2017-06-03 ENCOUNTER — Encounter: Payer: Self-pay | Admitting: Family Medicine

## 2017-06-03 VITALS — BP 128/78 | HR 83 | Temp 98.8°F | Wt 316.8 lb

## 2017-06-03 DIAGNOSIS — R0789 Other chest pain: Secondary | ICD-10-CM | POA: Diagnosis not present

## 2017-06-03 NOTE — ED Notes (Signed)
Spouse up to NF asking about wait time. Apologized for delay. Explained acuity and other patients will go in front of this patient due to critical results. Spouse said that husband has appointment at 0800 in the morning and say they are going home. Will return if worsening symptoms.

## 2017-06-03 NOTE — Progress Notes (Signed)
   Subjective:    Patient ID: Gerald Savage, male    DOB: 07/17/1985, 32 y.o.   MRN: [id number]  HPI He is here for evaluation of intermittent difficulty with chest tightness that started in February.  He notes this is especially true when he sits in a slouched position and the symptoms get better when he sits up straight.  No associated shortness of breath, diaphoresis, weakness, heart rate change.  He did go to the emergency room yesterday but did not stay for evaluation.  X-rays, blood work and EKG were done.   Review of Systems     Objective:   Physical Exam Alert and in no distress.  Cardiac exam shows regular rhythm without murmurs or gallops.  Lungs are clear to auscultation.  No chest wall tenderness.  Blood work, EKG and chest x-ray in the emergency room was normal.       Assessment & Plan:  Musculoskeletal chest pain I explained that his symptoms are musculoskeletal in nature especially since the symptoms go away very quickly after he sits in a more proper position.  This tends to bother him mainly when he drives and I therefore recommend proper posturing especially while driving.  He voiced understanding of this.

## 2017-07-02 ENCOUNTER — Telehealth: Payer: Self-pay | Admitting: Family Medicine

## 2017-07-02 NOTE — Telephone Encounter (Signed)
Pt was notified to call pharmacy and get this filled as it was already sent in back in January for 3 months

## 2017-07-02 NOTE — Telephone Encounter (Signed)
There is a prescription already written for 07/12/17

## 2017-07-02 NOTE — Telephone Encounter (Signed)
New Message   *STAT* If patient is at the pharmacy, call can be transferred to refill team.   1. Which medications need to be refilled? (please list name of each medication and dose if known)  Adderall 20 mg tablet twice daily  2. Which pharmacy/location (including street and city if local pharmacy) is medication to be sent to? [facility] [address] [place]  3. Do they need a 30 day or 90 day supply?  30 day supply

## 2017-09-10 ENCOUNTER — Telehealth: Payer: Self-pay | Admitting: Family Medicine

## 2017-09-10 MED ORDER — AMPHETAMINE-DEXTROAMPHETAMINE 20 MG PO TABS: 20.0000 mg | ORAL_TABLET | Freq: Two times a day (BID) | ORAL | 0 refills | Status: DC

## 2017-09-10 NOTE — Telephone Encounter (Signed)
Pt requested refill on Adderall 20 mg

## 2017-11-04 ENCOUNTER — Telehealth: Payer: Self-pay

## 2017-11-04 NOTE — Telephone Encounter (Signed)
Patient called stating he needs his Adderall before the 29th but the pharmacy told him that was the earliest he could get it. Patient stated he did not get the medication refilled in June. The last refill he got was in May. Patient also stated he is going out of town/ the country before the 29th and needs the medication. Please advise.

## 2017-11-05 NOTE — Telephone Encounter (Signed)
If he did not fill the June prescription that should still be good.  Check with the pharmacy and make sure that is true and then let him know

## 2017-11-05 NOTE — Telephone Encounter (Signed)
Pt actually picked up script yesterday from pharmacy. KH

## 2017-12-03 ENCOUNTER — Telehealth: Payer: Self-pay | Admitting: Family Medicine

## 2017-12-03 MED ORDER — AMPHETAMINE-DEXTROAMPHETAMINE 20 MG PO TABS: 20.0000 mg | ORAL_TABLET | Freq: Two times a day (BID) | ORAL | 0 refills | Status: DC

## 2017-12-03 NOTE — Telephone Encounter (Signed)
Pt called and states he needs a refill on his adderall [id number] [place], [place] - [address] and pt can be reached at [phone]

## 2017-12-18 ENCOUNTER — Encounter: Payer: Self-pay | Admitting: Family Medicine

## 2017-12-18 ENCOUNTER — Ambulatory Visit (INDEPENDENT_AMBULATORY_CARE_PROVIDER_SITE_OTHER): Payer: Commercial Managed Care - PPO | Admitting: Family Medicine

## 2017-12-18 VITALS — BP 128/86 | HR 91 | Temp 98.6°F | Wt 283.0 lb

## 2017-12-18 DIAGNOSIS — R634 Abnormal weight loss: Secondary | ICD-10-CM | POA: Diagnosis not present

## 2017-12-18 DIAGNOSIS — J020 Streptococcal pharyngitis: Secondary | ICD-10-CM | POA: Diagnosis not present

## 2017-12-18 LAB — POCT RAPID STREP A (OFFICE): Rapid Strep A Screen: POSITIVE — AB

## 2017-12-18 MED ORDER — AMOXICILLIN 875 MG PO TABS: 875.0000 mg | ORAL_TABLET | Freq: Two times a day (BID) | ORAL | 0 refills | Status: DC

## 2017-12-18 NOTE — Progress Notes (Signed)
   Subjective:    Patient ID: Gerald Savage, male    DOB: 11/06/1985, 32 y.o.   MRN: [id number]  HPI 3 days ago he noted the onset of slight sore throat followed by fever, chills, malaise and fatigue.  His wife was diagnosed recently with strep.  He has also been on a weight loss regimen increasing his aerobics and decreasing carbohydrates.  He is lost roughly 70 pounds.   Review of Systems     Objective:   Physical Exam Alert and in no distress. Tympanic membranes and canals are normal. Pharyngeal area is normal. Neck is supple without adenopathy or thyromegaly. Cardiac exam shows a regular sinus rhythm without murmurs or gallops. Lungs are clear to auscultation. Strep screen is positive       Assessment & Plan:  Strep pharyngitis - Plan: amoxicillin (AMOXIL) 875 MG tablet  Weight loss I congratulated him on his weight loss and encouraged him to continue with his diet and exercise regimen.  Recommend he has had a goal waist size rather than weight.

## 2017-12-18 NOTE — Addendum Note (Signed)
Addended by: Renelda Loma on: 12/18/2017 01:59 PM   Modules accepted: Orders

## 2018-02-17 ENCOUNTER — Encounter: Payer: Self-pay | Admitting: Family Medicine

## 2018-02-17 ENCOUNTER — Ambulatory Visit (INDEPENDENT_AMBULATORY_CARE_PROVIDER_SITE_OTHER): Payer: Commercial Managed Care - PPO | Admitting: Family Medicine

## 2018-02-17 VITALS — BP 120/82 | HR 72 | Temp 98.2°F | Ht 74.0 in | Wt 285.6 lb

## 2018-02-17 DIAGNOSIS — G44019 Episodic cluster headache, not intractable: Secondary | ICD-10-CM | POA: Diagnosis not present

## 2018-02-17 DIAGNOSIS — M7581 Other shoulder lesions, right shoulder: Secondary | ICD-10-CM

## 2018-02-17 DIAGNOSIS — R634 Abnormal weight loss: Secondary | ICD-10-CM

## 2018-02-17 DIAGNOSIS — Z833 Family history of diabetes mellitus: Secondary | ICD-10-CM

## 2018-02-17 DIAGNOSIS — Z23 Encounter for immunization: Secondary | ICD-10-CM

## 2018-02-17 DIAGNOSIS — F988 Other specified behavioral and emotional disorders with onset usually occurring in childhood and adolescence: Secondary | ICD-10-CM | POA: Diagnosis not present

## 2018-02-17 DIAGNOSIS — Z0000 Encounter for general adult medical examination without abnormal findings: Secondary | ICD-10-CM | POA: Diagnosis not present

## 2018-02-17 LAB — POCT URINALYSIS DIP (PROADVANTAGE DEVICE)
BILIRUBIN UA: NEGATIVE
GLUCOSE UA: NEGATIVE mg/dL
Ketones, POC UA: NEGATIVE mg/dL
Leukocytes, UA: NEGATIVE
Nitrite, UA: NEGATIVE
Protein Ur, POC: NEGATIVE mg/dL
RBC UA: NEGATIVE
SPECIFIC GRAVITY, URINE: 1.015
Urobilinogen, Ur: 3.5
pH, UA: 6 (ref 5.0–8.0)

## 2018-02-17 MED ORDER — AMPHETAMINE-DEXTROAMPHET ER 20 MG PO CP24: 20.0000 mg | ORAL_CAPSULE | Freq: Two times a day (BID) | ORAL | 0 refills | Status: DC

## 2018-02-17 NOTE — Progress Notes (Signed)
Subjective:    Patient ID: CID AGENA, male    DOB: March 09, 1986, 32 y.o.   MRN: [id number]  HPI He is here for complete examination.  He continues to lose weight through diet and exercise and is very happy with this.  He has noted a slight decrease in the weight loss as his weight has gone down.  Also his Adderall seems to only work for 3 or 4 hours and he is concerned over needing more of this medication.  When he takes that he does get good focus with this.  He had no more cluster headaches.  He also has a several month history of difficulty with right shoulder pain.  He describes playing basketball and having an abduction/external rotation type movement of the shoulder with subsequent pain.  Since then same kind maneuver causes pain.  He has no other concerns or complaints.  Family and social history as well as health maintenance and immunizations was reviewed.   Review of Systems  All other systems reviewed and are negative.      Objective:   Physical Exam BP 120/82 (BP Location: Left Arm, Patient Position: Sitting)   Pulse 72   Temp 98.2 F (36.8 C)   Ht 6\' 2"  (1.88 m)   Wt 285 lb 9.6 oz (129.5 kg)   SpO2 98%   BMI 36.67 kg/m   General Appearance:    Alert, cooperative, no distress, appears stated age  Head:    Normocephalic, without obvious abnormality, atraumatic  Eyes:    PERRL, conjunctiva/corneas clear, EOM's intact, fundi    benign  Ears:    Normal TM's and external ear canals  Nose:   Nares normal, mucosa normal, no drainage or sinus   tenderness  Throat:   Lips, mucosa, and tongue normal; teeth and gums normal  Neck:   Supple, no lymphadenopathy;  thyroid:  no   enlargement/tenderness/nodules; no carotid   bruit or JVD  Back:    Spine nontender, no curvature, ROM normal, no CVA     tenderness  Lungs:     Clear to auscultation bilaterally without wheezes, rales or     ronchi; respirations unlabored  Chest Wall:    No tenderness or deformity   Heart:     Regular rate and rhythm, S1 and S2 normal, no murmur, rub   or gallop     Abdomen:     Soft, non-tender, nondistended, normoactive bowel sounds,    no masses, no hepatosplenomegaly  Genitalia:    Normal male external genitalia without lesions.  Testicles without masses.  No inguinal hernias.  Rectal:   Deferred due to age <40 and lack of symptoms  Extremities:   No clubbing, cyanosis or edema.right shoulder exam shows full motion of the shoulder.  Drop arm test was uncomfortable.  Neer's and Hawkins test was also painful.  Negative sulcus sign.  No laxity noted.  Normal strength.  Pulses:   2+ and symmetric all extremities  Skin:   Skin color, texture, turgor normal, no rashes or lesions  Lymph nodes:   Cervical, supraclavicular, and axillary nodes normal  Neurologic:   CNII-XII intact, normal strength, sensation and gait; reflexes 2+ and symmetric throughout          Psych:   Normal mood, affect, hygiene and grooming.          Assessment & Plan:  Routine general medical examination at a health care facility - Plan: CBC with Differential/Platelet, Comprehensive metabolic panel, Lipid panel  Need for influenza vaccination - Plan: Flu Vaccine QUAD 6+ mos PF IM (Fluarix Quad PF)  Weight loss - Plan: CBC with Differential/Platelet, Comprehensive metabolic panel, Lipid panel  Attention deficit disorder (ADD) without hyperactivity - Plan: amphetamine-dextroamphetamine (ADDERALL XR) 20 MG 24 hr capsule  Episodic cluster headache, not intractable  Obesity, Class III, BMI 40-49.9 (morbid obesity) (HCC) - Plan: CBC with Differential/Platelet, Comprehensive metabolic panel, Lipid panel  Tendinitis of right rotator cuff - Plan: Ambulatory referral to Physical Therapy I congratulated him on his weight loss.  Discussed the fact that his he continues to lose weight, he will come off much more slowly. I will give him Adderall XR 20 mg to be taken twice per day.  He will let me know if it works, how  long it works and if he has any side effects. Discussed treatment of the right shoulder pain.  We will start initially with physical therapy.  Also discussed possible injection and and further work-up based on his response.  He was comfortable with that.

## 2018-02-18 LAB — CBC WITH DIFFERENTIAL/PLATELET
Basophils Absolute: 0 10*3/uL (ref 0.0–0.2)
Basos: 1 %
EOS (ABSOLUTE): 0 10*3/uL (ref 0.0–0.4)
EOS: 1 %
Hematocrit: 43 % (ref 37.5–51.0)
Hemoglobin: 14.3 g/dL (ref 13.0–17.7)
IMMATURE GRANS (ABS): 0 10*3/uL (ref 0.0–0.1)
IMMATURE GRANULOCYTES: 0 %
Lymphocytes Absolute: 2 10*3/uL (ref 0.7–3.1)
Lymphs: 43 %
MCH: 29.2 pg (ref 26.6–33.0)
MCHC: 33.3 g/dL (ref 31.5–35.7)
MCV: 88 fL (ref 79–97)
MONOS ABS: 0.3 10*3/uL (ref 0.1–0.9)
Monocytes: 7 %
NEUTROS PCT: 48 %
Neutrophils Absolute: 2.2 10*3/uL (ref 1.4–7.0)
PLATELETS: 292 10*3/uL (ref 150–450)
RBC: 4.9 x10E6/uL (ref 4.14–5.80)
RDW: 13.2 % (ref 12.3–15.4)
WBC: 4.5 10*3/uL (ref 3.4–10.8)

## 2018-02-18 LAB — LIPID PANEL
CHOL/HDL RATIO: 2 ratio (ref 0.0–5.0)
Cholesterol, Total: 121 mg/dL (ref 100–199)
HDL: 60 mg/dL (ref >39)
LDL Calculated: 53 mg/dL (ref 0–99)
TRIGLYCERIDES: 39 mg/dL (ref 0–149)
VLDL Cholesterol Cal: 8 mg/dL (ref 5–40)

## 2018-02-18 LAB — COMPREHENSIVE METABOLIC PANEL
A/G RATIO: 1.4 (ref 1.2–2.2)
ALT: 21 IU/L (ref 0–44)
AST: 24 IU/L (ref 0–40)
Albumin: 4.3 g/dL (ref 3.5–5.5)
Alkaline Phosphatase: 67 IU/L (ref 39–117)
BUN/Creatinine Ratio: 11 (ref 9–20)
BUN: 9 mg/dL (ref 6–20)
Bilirubin Total: 0.5 mg/dL (ref 0.0–1.2)
CALCIUM: 9.8 mg/dL (ref 8.7–10.2)
CO2: 24 mmol/L (ref 20–29)
CREATININE: 0.84 mg/dL (ref 0.76–1.27)
Chloride: 101 mmol/L (ref 96–106)
GFR calc Af Amer: 134 mL/min/{1.73_m2} (ref >59)
GFR calc non Af Amer: 116 mL/min/{1.73_m2} (ref >59)
GLUCOSE: 76 mg/dL (ref 65–99)
Globulin, Total: 3 g/dL (ref 1.5–4.5)
POTASSIUM: 4.6 mmol/L (ref 3.5–5.2)
Sodium: 139 mmol/L (ref 134–144)
Total Protein: 7.3 g/dL (ref 6.0–8.5)

## 2018-02-19 ENCOUNTER — Telehealth: Payer: Self-pay | Admitting: Family Medicine

## 2018-02-19 MED ORDER — AMPHETAMINE-DEXTROAMPHETAMINE 20 MG PO TABS: 20.0000 mg | ORAL_TABLET | Freq: Three times a day (TID) | ORAL | 0 refills | Status: DC

## 2018-02-19 NOTE — Telephone Encounter (Signed)
He states that the Adderall XR was too expensive.  I will therefore switch him to 3 times daily dosing of the 20 mg.  He will call me in a month and let me know how this new regimen is working.  The previous information shows that the 20 mg tablet was really only working for 3 or 4 hours.

## 2018-02-19 NOTE — Telephone Encounter (Signed)
Pt called and is wondering if you would switch his adderall to the 30 mg 2 times daily the XR was 51 dollars a months and he states that is to high for him, [id number] [place], [place] - [address] and pt can be reached at [phone]

## 2018-03-03 ENCOUNTER — Encounter: Payer: Self-pay | Admitting: Physical Therapy

## 2018-03-03 ENCOUNTER — Ambulatory Visit: Payer: Commercial Managed Care - PPO | Attending: Family Medicine | Admitting: Physical Therapy

## 2018-03-03 ENCOUNTER — Other Ambulatory Visit: Payer: Self-pay

## 2018-03-03 DIAGNOSIS — M6281 Muscle weakness (generalized): Secondary | ICD-10-CM | POA: Insufficient documentation

## 2018-03-03 DIAGNOSIS — G8929 Other chronic pain: Secondary | ICD-10-CM | POA: Insufficient documentation

## 2018-03-03 DIAGNOSIS — M25511 Pain in right shoulder: Secondary | ICD-10-CM | POA: Insufficient documentation
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
## 2018-03-17 ENCOUNTER — Ambulatory Visit: Payer: Commercial Managed Care - PPO | Attending: Family Medicine | Admitting: Physical Therapy

## 2018-03-17 ENCOUNTER — Encounter: Payer: Self-pay | Admitting: Physical Therapy

## 2018-03-17 DIAGNOSIS — M6281 Muscle weakness (generalized): Secondary | ICD-10-CM | POA: Diagnosis present

## 2018-03-17 DIAGNOSIS — M25511 Pain in right shoulder: Secondary | ICD-10-CM | POA: Insufficient documentation

## 2018-03-17 DIAGNOSIS — G8929 Other chronic pain: Secondary | ICD-10-CM | POA: Insufficient documentation

## 2018-03-17 NOTE — Therapy (Signed)
[facility] Outpatient Rehabilitation [facility] [address] [place], [place], [address] Phone: [phone]   Fax:  [phone]  Physical Therapy Treatment  Patient Details  Name: Gerald Savage MRN: [id number] Date of Birth: 10/15/1985 Referring Provider (PT): Sharlot Gowda MD   Encounter Date: 03/17/2018  PT End of Session - 03/17/18 1001    Visit Number  2    Number of Visits  8    Date for PT Re-Evaluation  03/31/18    PT Start Time  0848    PT Stop Time  0953    PT Time Calculation (min)  65 min    Activity Tolerance  Patient tolerated treatment well    Behavior During Therapy  [facility] for tasks assessed/performed       Past Medical History:  Diagnosis Date  . Folliculitis   . Migraines     History reviewed. No pertinent surgical history.  There were no vitals filed for this visit.  Subjective Assessment - 03/17/18 0853    Subjective    has  been doing the exercise.  Door way stretch  every day.    Serratus press does not help as much,  trigger point release helps as much.  has exercises  emorized.     Pain Score  0-No pain   up to 4/10   Pain Location  Shoulder    Pain Orientation  Right;Anterior    Pain Descriptors / Indicators  Sharp    Pain Frequency  Intermittent    Aggravating Factors   abduction,  sleeping on shoulder,  quick movements.  ,  reaching behind.      Pain Relieving Factors  exercise,  trigger point release    Effect of Pain on Daily Activities  limited carrying      Multiple Pain Sites  --   has back pain see chiropractor in the summer.                       [facility] Adult PT Treatment/Exercise - 03/17/18 0001      Self-Care   Self-Care  Other Self-Care Comments   chart and shoulder model used.    Other Self-Care Comments   anatomy.  impingement education.        Shoulder Exercises: Supine   Protraction  10 reps   cued shoulders away from ears.  to increase comfort.     Shoulder Exercises: Standing   Other Standing Exercises  lower trap Wall Y's with lift off 1 x 15   cued for lift off     Shoulder Exercises: Isometric Strengthening   Extension  5X5"    External Rotation  5X5"   cued light to avod pain   Internal Rotation  5X5"    ABduction  5X5"      Shoulder Exercises: Stretch   Other Shoulder Stretches  rhomboid stretch at door,  single arm  cued    Other Shoulder Stretches  upper trap stretch 2 x 30 second   impingement info      Moist Heat Therapy   Number Minutes Moist Heat  15 Minutes    Moist Heat Location  Shoulder      Manual Therapy   Manual therapy comments  anterior deltoid and teres minor trigger point release.  4 total  pain decreased.              PT Education - 03/17/18 1001    Education Details  self care,  exercise  technique.    Person(s) Educated  Patient    Methods  Explanation;Demonstration;Tactile cues;Verbal cues    Comprehension  Verbalized understanding;Returned demonstration       PT Short Term Goals - 03/17/18 1004      PT SHORT TERM GOAL #1   Title  pt to be I with inital HEP    Baseline  minor cues    Time  2    Period  Weeks    Status  On-going        PT Long Term Goals - 03/03/18 0902      PT LONG TERM GOAL #1   Title  pt to increase R shoulder strength to 5/5 in all planes with no pain reported for shoulder stability with reaching overhead    Time  4    Period  Weeks    Status  New    Target Date  03/31/18      PT LONG TERM GOAL #2   Title  pt to be able to return to playing basketball and golfing with no report of pain for personal goals     Time  4    Period  Weeks    Status  New    Target Date  03/31/18      PT LONG TERM GOAL #3   Title  increase FOTO score to </= 23% limited to demo improvement in function     Time  4    Period  Weeks    Status  New    Target Date  03/31/18      PT LONG TERM GOAL #4   Title  pt to be I with all HEP given as of last visit to maintain and progress current level of  function     Time  4    Period  Weeks    Status  New    Target Date  03/31/18            Plan - 03/17/18 1002    Clinical Impression Statement  Progress toward HEP goals,  minor cues needed.  Extra time spent on education due to patient doing his own research and was wondering about needing surgery.   Imtermittant pain during session usually decreased with cues for technique.      PT Next Visit Plan  review/ update HEP, peri-scapular strengthening, STW along anterior deltoid, Rhomboids and teres minor/ infraspinatus,     PT Home Exercise Plan  ceiling punches, lower trap wall y's, upper trap and rhomboid stretching  consider isometrics  for home    Consulted and Agree with Plan of Care  Patient       Patient will benefit from skilled therapeutic intervention in order to improve the following deficits and impairments:     Visit Diagnosis: Chronic right shoulder pain  Muscle weakness (generalized)     Problem List Patient Active Problem List   Diagnosis Date Noted  . Family history of diabetes mellitus 02/17/2018  . Attention deficit disorder (ADD) without hyperactivity 02/13/2017  . Cluster headaches 06/26/2011  . Obesity, Class III, BMI 40-49.9 (morbid obesity) (HCC) 06/26/2011    Blessin Kanno  PTA 03/17/2018, 10:05 AM  [facility] [address] [place], [place], [address] Phone: [phone]   Fax:  [phone]  Name: Gerald Savage MRN: [id number] Date of Birth: 10/19/1985

## 2018-03-20 ENCOUNTER — Encounter: Payer: Self-pay | Admitting: Physical Therapy

## 2018-03-20 ENCOUNTER — Ambulatory Visit: Payer: Commercial Managed Care - PPO | Admitting: Physical Therapy

## 2018-03-20 DIAGNOSIS — M6281 Muscle weakness (generalized): Secondary | ICD-10-CM

## 2018-03-20 DIAGNOSIS — M25511 Pain in right shoulder: Secondary | ICD-10-CM | POA: Diagnosis not present

## 2018-03-20 DIAGNOSIS — G8929 Other chronic pain: Secondary | ICD-10-CM

## 2018-03-20 NOTE — Patient Instructions (Signed)

## 2018-03-20 NOTE — Therapy (Signed)
[facility] [address] [place], [place], [address] Phone: [phone]   Fax:  [phone]  Physical Therapy Treatment  Patient Details  Name: Gerald Savage MRN: [id number] Date of Birth: 02/10/1986 Referring Provider (PT): Sharlot Gowdajohn Lalonde MD   Encounter Date: 03/20/2018  PT End of Session - 03/20/18 0845    Visit Number  3    Number of Visits  8    Date for PT Re-Evaluation  03/31/18    PT Start Time  0845    PT Stop Time  0925    PT Time Calculation (min)  40 min    Activity Tolerance  Patient tolerated treatment well    Behavior During Therapy  [facility] for tasks assessed/performed       Past Medical History:  Diagnosis Date  . Folliculitis   . Migraines     History reviewed. No pertinent surgical history.  There were no vitals filed for this visit.  Subjective Assessment - 03/20/18 0846    Subjective  "I am still having soreness in the shoulder with pain all the time,     Currently in Pain?  Yes    Pain Score  5     Pain Location  Shoulder    Pain Orientation  Right;Anterior    Pain Descriptors / Indicators  Sore                       [facility] Adult PT Treatment/Exercise - 03/20/18 0001      Exercises   Exercises  Shoulder      Shoulder Exercises: Supine   Protraction  15 reps      Shoulder Exercises: ROM/Strengthening   UBE (Upper Arm Bike)  L1 x 4 min    changing direction at 2 min     Modalities   Modalities  Iontophoresis      Iontophoresis   Type of Iontophoresis  Dexamethasone    Location  R anterior shoulder    Dose  4mg /ml    Time  6 hour patch      Manual Therapy   Manual Therapy  Soft tissue mobilization    Manual therapy comments  skilled palpation and monitoring of pt throughout TPDN    Soft tissue mobilization  IASTM along the anteror deltoid and proxima bicep brachii       Trigger Point Dry Needling - 03/20/18 0902    Consent Given?  Yes    Muscles Treated Upper  Body  --   anterior deltoid  and bicep           PT Education - 03/20/18 0852    Education Details  muscle anatomy and referral patterns. What TPDN is, benefits of treatment and what to expect. iontophoreiss medication.     Person(s) Educated  Patient    Methods  Explanation;Verbal cues    Comprehension  Verbalized understanding;Verbal cues required       PT Short Term Goals - 03/17/18 1004      PT SHORT TERM GOAL #1   Title  pt to be I with inital HEP    Baseline  minor cues    Time  2    Period  Weeks    Status  On-going        PT Long Term Goals - 03/03/18 0902      PT LONG TERM GOAL #1   Title  pt to increase R shoulder strength to 5/5 in all planes with  no pain reported for shoulder stability with reaching overhead    Time  4    Period  Weeks    Status  New    Target Date  03/31/18      PT LONG TERM GOAL #2   Title  pt to be able to return to playing basketball and golfing with no report of pain for personal goals     Time  4    Period  Weeks    Status  New    Target Date  03/31/18      PT LONG TERM GOAL #3   Title  increase FOTO score to </= 23% limited to demo improvement in function     Time  4    Period  Weeks    Status  New    Target Date  03/31/18      PT LONG TERM GOAL #4   Title  pt to be I with all HEP given as of last visit to maintain and progress current level of function     Time  4    Period  Weeks    Status  New    Target Date  03/31/18            Plan - 03/20/18 1610    Clinical Impression Statement  continues pain noted at 5/10 especially with any lifting or carrying activity. educated and consent was provided for DN along the anterior deltoid and bicep brachii followed with IASTM techinques. continued strengthening and trialed iontophoresis. end of session he reported only muscle soreness but no pain.     PT Treatment/Interventions  ADLs/Self Care Home Management;Cryotherapy;Electrical Stimulation;Iontophoresis 4mg /ml  Dexamethasone;Traction;Ultrasound;Neuromuscular re-education;Therapeutic activities;Moist Heat;Therapeutic exercise;Manual techniques;Taping;Dry needling;Patient/family education    PT Next Visit Plan  update HEP, how was DN and ionto, peri-scapular strengthening, STW along anterior deltoid, Rhomboids and teres minor/ infraspinatus,     PT Home Exercise Plan  ceiling punches, lower trap wall y's, upper trap and rhomboid stretching      Consulted and Agree with Plan of Care  Patient       Patient will benefit from skilled therapeutic intervention in order to improve the following deficits and impairments:  Pain, Impaired UE functional use, Decreased strength, Decreased knowledge of precautions, Postural dysfunction, Improper body mechanics, Decreased activity tolerance, Decreased endurance  Visit Diagnosis: Chronic right shoulder pain  Muscle weakness (generalized)     Problem List Patient Active Problem List   Diagnosis Date Noted  . Family history of diabetes mellitus 02/17/2018  . Attention deficit disorder (ADD) without hyperactivity 02/13/2017  . Cluster headaches 06/26/2011  . Obesity, Class III, BMI 40-49.9 (morbid obesity) (HCC) 06/26/2011   Lulu Riding PT, DPT, LAT, ATC  03/20/18  9:30 AM      [facility] [address] [place], [place], [address] Phone: [phone]   Fax:  [phone]  Name: Gerald Savage MRN: [id number] Date of Birth: [birth year]

## 2018-03-23 ENCOUNTER — Encounter: Payer: Self-pay | Admitting: Physical Therapy

## 2018-03-23 ENCOUNTER — Ambulatory Visit: Payer: Commercial Managed Care - PPO | Admitting: Physical Therapy

## 2018-03-23 DIAGNOSIS — M6281 Muscle weakness (generalized): Secondary | ICD-10-CM

## 2018-03-23 DIAGNOSIS — M25511 Pain in right shoulder: Principal | ICD-10-CM

## 2018-03-23 DIAGNOSIS — G8929 Other chronic pain: Secondary | ICD-10-CM

## 2018-03-23 NOTE — Therapy (Signed)
[facility] Outpatient Rehabilitation [facility] [address] [place], [place], [address] Phone: [phone]   Fax:  [phone]  Physical Therapy Treatment  Patient Details  Name: Gerald Savage MRN: [id number] Date of Birth: [birth year] Referring Provider (PT): Sharlot Gowda MD   Encounter Date: 03/23/2018  PT End of Session - 03/23/18 0935    Visit Number  4    Number of Visits  8    Date for PT Re-Evaluation  03/31/18    PT Start Time  0849    PT Stop Time  0942    PT Time Calculation (min)  53 min    Activity Tolerance  Patient tolerated treatment well    Behavior During Therapy  [facility] for tasks assessed/performed       Past Medical History:  Diagnosis Date  . Folliculitis   . Migraines     History reviewed. No pertinent surgical history.  There were no vitals filed for this visit.  Subjective Assessment - 03/23/18 0852    Subjective  Dry needle and patch worked.  No pain until I got the gel.  Exercises are going OK.    Sleeping better.  not sleeping on his arm yet.    Currently in Pain?  No/denies    Pain Score  0-No pain   up to 8/10  brief   Pain Location  Shoulder    Pain Orientation  Right;Anterior    Pain Descriptors / Indicators  Sharp    Pain Type  Chronic pain    Aggravating Factors   reaching a certain way    Pain Relieving Factors  DN,  ionto                       [facility] Adult PT Treatment/Exercise - 03/23/18 0001      Shoulder Exercises: Supine   Protraction Limitations  cued pain free    Horizontal ABduction  10 reps;Strengthening;Both;Theraband    Theraband Level (Shoulder Horizontal ABduction)  Level 2 (Red)    External Rotation  10 reps    Theraband Level (Shoulder External Rotation)  Level 2 (Red)    Flexion  10 reps;AAROM    Theraband Level (Shoulder Flexion)  Level 2 (Red)   painful so stopped 5 reps   Diagonals  10 reps   blocked range across body ,  sash  to decrease pain   Theraband Level (Shoulder  Diagonals)  Level 1 (Yellow)    Other Supine Exercises  PTA holding band red, 10 X shoulder extensin      Shoulder Exercises: ROM/Strengthening   UBE (Upper Arm Bike)  4 minutes all forwaed due to 3-4/10 pain with reverse.        Cryotherapy   Number Minutes Cryotherapy  10 Minutes    Cryotherapy Location  Shoulder    Type of Cryotherapy  --   cold pack     Iontophoresis   Type of Iontophoresis  Dexamethasone    Location  RT anterior shoulder    Dose  4 mg/ml    Time  6 hour patch   info on how it works.      Manual Therapy   Manual Therapy  Soft tissue mobilization    Soft tissue mobilization  instrument assist shoulder posterior, superior and biceps  several tender spots found and softened in part.               PT Education - 03/23/18 0935    Education Details  how ionto works.     Person(s) Educated  Patient    Comprehension  Verbalized understanding       PT Short Term Goals - 03/23/18 0939      PT SHORT TERM GOAL #1   Title  pt to be I with inital HEP    Time  2    Period  Weeks    Status  On-going        PT Long Term Goals - 03/03/18 0902      PT LONG TERM GOAL #1   Title  pt to increase R shoulder strength to 5/5 in all planes with no pain reported for shoulder stability with reaching overhead    Time  4    Period  Weeks    Status  New    Target Date  03/31/18      PT LONG TERM GOAL #2   Title  pt to be able to return to playing basketball and golfing with no report of pain for personal goals     Time  4    Period  Weeks    Status  New    Target Date  03/31/18      PT LONG TERM GOAL #3   Title  increase FOTO score to </= 23% limited to demo improvement in function     Time  4    Period  Weeks    Status  New    Target Date  03/31/18      PT LONG TERM GOAL #4   Title  pt to be I with all HEP given as of last visit to maintain and progress current level of function     Time  4    Period  Weeks    Status  New    Target Date  03/31/18             Plan - 03/23/18 0936    Clinical Impression Statement  Pain with exercises required cued for pain free.  Beginninf scapular stabilization exercises customized as needed.  DN and iontophoresis have helped patient make progress toward pain goals . Patient needs more work with posture.   Pain 4/10 at end of session following manual.   ROM supine with cane flexion WNL.     PT Next Visit Plan  update HEP( not quite ready today,  needed multiple cues), how was DN and ionto, peri-scapular strengthening, STW along anterior deltoid, Rhomboids and teres minor/ infraspinatus,     PT Home Exercise Plan  ceiling punches, lower trap wall y's, upper trap and rhomboid stretching      Consulted and Agree with Plan of Care  Patient       Patient will benefit from skilled therapeutic intervention in order to improve the following deficits and impairments:     Visit Diagnosis: Chronic right shoulder pain  Muscle weakness (generalized)     Problem List Patient Active Problem List   Diagnosis Date Noted  . Family history of diabetes mellitus 02/17/2018  . Attention deficit disorder (ADD) without hyperactivity 02/13/2017  . Cluster headaches 06/26/2011  . Obesity, Class III, BMI 40-49.9 (morbid obesity) (HCC) 06/26/2011    Kelvin Burpee PTA 03/23/2018, 9:41 AM  [facility] [address] [place], [place], [address] Phone: [phone]   Fax:  [phone]  Name: Gerald Savage MRN: [id number] Date of Birth: 07/14/1985

## 2018-03-25 ENCOUNTER — Ambulatory Visit: Payer: Commercial Managed Care - PPO | Admitting: Physical Therapy

## 2018-03-25 ENCOUNTER — Encounter: Payer: Self-pay | Admitting: Physical Therapy

## 2018-03-25 DIAGNOSIS — M25511 Pain in right shoulder: Secondary | ICD-10-CM | POA: Diagnosis not present

## 2018-03-25 DIAGNOSIS — G8929 Other chronic pain: Secondary | ICD-10-CM

## 2018-03-25 DIAGNOSIS — M6281 Muscle weakness (generalized): Secondary | ICD-10-CM

## 2018-03-25 NOTE — Therapy (Signed)
[facility] Outpatient Rehabilitation [facility] [address] [place], [place], [address] Phone: [phone]   Fax:  [phone]  Physical Therapy Treatment  Patient Details  Name: ARION SHANKLES MRN: [id number] Date of Birth: 11/13/1985 Referring Provider (PT): Sharlot Gowda MD   Encounter Date: 03/25/2018  PT End of Session - 03/25/18 0851    Visit Number  5    Number of Visits  8    Date for PT Re-Evaluation  03/31/18    PT Start Time  0847    PT Stop Time  0932    PT Time Calculation (min)  45 min    Activity Tolerance  Patient tolerated treatment well    Behavior During Therapy  [facility] for tasks assessed/performed       Past Medical History:  Diagnosis Date  . Folliculitis   . Migraines     History reviewed. No pertinent surgical history.  There were no vitals filed for this visit.  Subjective Assessment - 03/25/18 0851    Subjective  "the DN really helped, I woke up feelin more sore today at 7/10"    Currently in Pain?  Yes    Pain Score  7     Pain Orientation  Right    Pain Descriptors / Indicators  Aching;Sore    Pain Type  Chronic pain    Pain Onset  More than a month ago    Pain Frequency  Intermittent                       [facility] Adult PT Treatment/Exercise - 03/25/18 0859      Exercises   Exercises  Shoulder      Shoulder Exercises: Supine   External Rotation  10 reps    Theraband Level (Shoulder External Rotation)  Level 2 (Red)      Shoulder Exercises: Standing   Horizontal ABduction  Strengthening;Both;10 reps;Theraband    Theraband Level (Shoulder Horizontal ABduction)  Level 2 (Red)    External Rotation  10 reps;Theraband    Theraband Level (Shoulder External Rotation)  Level 2 (Red)    External Rotation Limitations  focusing on eccentric loading    Other Standing Exercises  lower trap strengthening with elbows on bolster 1 x15 with red theraband    Other Standing Exercises  eccentric bicep curl 1 x 15    8#     Shoulder Exercises: ROM/Strengthening   UBE (Upper Arm Bike)  L3 x 6 min    changing direction at 3 min     Iontophoresis   Type of Iontophoresis  Dexamethasone    Location  RT anterior shoulder    Dose  4 mg/ml    Time  6 hour patch      Manual Therapy   Manual therapy comments  skilled palpation and monitoring of pt throughout TPDN    Soft tissue mobilization  IASTM along  proximal bicep and deltoid, infraspinatus / teres minor       Trigger Point Dry Needling - 03/25/18 0854    Consent Given?  Yes    Education Handout Provided  No    Muscles Treated Upper Body  Infraspinatus    Infraspinatus Response  Twitch response elicited;Palpable increased muscle length   teres minor on the R            PT Short Term Goals - 03/25/18 0951      PT SHORT TERM GOAL #1   Title  pt to be  I with inital HEP    Time  2    Period  Weeks    Status  Achieved        PT Long Term Goals - 03/03/18 0902      PT LONG TERM GOAL #1   Title  pt to increase R shoulder strength to 5/5 in all planes with no pain reported for shoulder stability with reaching overhead    Time  4    Period  Weeks    Status  New    Target Date  03/31/18      PT LONG TERM GOAL #2   Title  pt to be able to return to playing basketball and golfing with no report of pain for personal goals     Time  4    Period  Weeks    Status  New    Target Date  03/31/18      PT LONG TERM GOAL #3   Title  increase FOTO score to </= 23% limited to demo improvement in function     Time  4    Period  Weeks    Status  New    Target Date  03/31/18      PT LONG TERM GOAL #4   Title  pt to be I with all HEP given as of last visit to maintain and progress current level of function     Time  4    Period  Weeks    Status  New    Target Date  03/31/18            Plan - 03/25/18 0931    Clinical Impression Statement  pt reported increased pain and soreness at 7/10. continued TPDN for the biceps, deltoid and  infrapsinatus/ teres minor followed with IASTM [employer]. conitnued strengthening for shoulder stabilizers which he reported decreased the soreness. continued ionto, end of session he reported pain dropped to     PT Treatment/Interventions  ADLs/Self Care Home Management;Cryotherapy;Electrical Stimulation;Iontophoresis 4mg /ml Dexamethasone;Traction;Ultrasound;Neuromuscular re-education;Therapeutic activities;Moist Heat;Therapeutic exercise;Manual techniques;Taping;Dry needling;Patient/family education    PT Next Visit Plan  ERO, update HEP, how was DN and ionto, peri-scapular strengthening, STW along anterior deltoid, Rhomboids and teres minor/ infraspinatus,     PT Home Exercise Plan  ceiling punches, lower trap wall y's, upper trap and rhomboid stretching      Consulted and Agree with Plan of Care  Patient       Patient will benefit from skilled therapeutic intervention in order to improve the following deficits and impairments:  Pain, Impaired UE functional use, Decreased strength, Decreased knowledge of precautions, Postural dysfunction, Improper body mechanics, Decreased activity tolerance, Decreased endurance  Visit Diagnosis: Chronic right shoulder pain  Muscle weakness (generalized)     Problem List Patient Active Problem List   Diagnosis Date Noted  . Family history of diabetes mellitus 02/17/2018  . Attention deficit disorder (ADD) without hyperactivity 02/13/2017  . Cluster headaches 06/26/2011  . Obesity, Class III, BMI 40-49.9 (morbid obesity) (HCC) 06/26/2011   Lulu RidingKristoffer Mariyana Fulop PT, DPT, LAT, ATC  03/25/18  9:52 AM      [facility] [address] [place], [place], [address] Phone: [phone]   Fax:  [phone]  Name: Lenna Sciaraathaniel T Christina MRN: [id number] Date of Birth: 06/09/1985

## 2018-04-06 ENCOUNTER — Encounter: Payer: Commercial Managed Care - PPO | Admitting: Physical Therapy

## 2018-04-09 ENCOUNTER — Encounter: Payer: Commercial Managed Care - PPO | Admitting: Physical Therapy

## 2018-04-13 ENCOUNTER — Encounter: Payer: Self-pay | Admitting: Physical Therapy

## 2018-04-13 ENCOUNTER — Ambulatory Visit: Payer: Commercial Managed Care - PPO | Admitting: Physical Therapy

## 2018-04-13 DIAGNOSIS — M25511 Pain in right shoulder: Secondary | ICD-10-CM | POA: Diagnosis not present

## 2018-04-13 DIAGNOSIS — G8929 Other chronic pain: Secondary | ICD-10-CM

## 2018-04-13 DIAGNOSIS — M6281 Muscle weakness (generalized): Secondary | ICD-10-CM

## 2018-04-13 NOTE — Therapy (Signed)
[facility] [address] [place], [place], [address] Phone: [phone]   Fax:  [phone]  Physical Therapy Treatment / Re-certification  Patient Details  Name: Gerald Savage MRN: [id number] Date of Birth: 10/11/1985 Referring Provider (PT): Sharlot Gowdajohn Lalonde MD   Encounter Date: 04/13/2018  PT End of Session - 04/13/18 0847    Visit Number  6    Number of Visits  12    Date for PT Re-Evaluation  05/25/18    PT Start Time  0847    PT Stop Time  0930    PT Time Calculation (min)  43 min    Activity Tolerance  Patient tolerated treatment well    Behavior During Therapy  [facility] for tasks assessed/performed       Past Medical History:  Diagnosis Date  . Folliculitis   . Migraines     History reviewed. No pertinent surgical history.  There were no vitals filed for this visit.  Subjective Assessment - 04/13/18 0849    Subjective  " I m actually doing better, still feeling some soreness but it isn't as bad it used to be"     Currently in Pain?  Yes    Pain Score  3     Pain Location  Shoulder    Pain Orientation  Right    Pain Onset  More than a month ago    Pain Frequency  Intermittent    Aggravating Factors   sleeping or laying it too long, holding daughter for too long and quick movement.     Pain Relieving Factors  Dn, ionto         [facility] PT Assessment - 04/13/18 0001      AROM   Overall AROM   Within functional limits for tasks performed      Strength   Right Shoulder Flexion  4-/5    Right Shoulder Extension  5/5    Right Shoulder ABduction  4+/5   pain during testing   Right Shoulder Internal Rotation  5/5    Right Shoulder External Rotation  4+/5                   [facility] Adult PT Treatment/Exercise - 04/13/18 0001      Shoulder Exercises: Supine   Protraction  Weights;Strengthening;Right   sustained protraction with CW/CCW circles 6# 2 x 20      Shoulder Exercises: Prone   Other  Prone Exercises  I's, Y's and T's 1 x 10 ea. with 1#      Shoulder Exercises: ROM/Strengthening   UBE (Upper Arm Bike)  L3 x 6 min    changing direction at 3 min     Shoulder Exercises: Stretch   Other Shoulder Stretches  bicep stretch (3 way) 1 x 30 sec hold ea.       Shoulder Exercises: Body Blade   Flexion  30 seconds;3 reps    Other Body Blade Exercises  IR/ER 2 x 10      Iontophoresis   Type of Iontophoresis  Dexamethasone    Location  RT anterior shoulder    Dose  4 mg/ml    Time  6 hour patch      Manual Therapy   Manual therapy comments  skilled palpation and monitoring of pt throughout TPDN    Soft tissue mobilization  IASTM along  proximal bicep and deltoid, infraspinatus / teres minor       Trigger Point Dry Needling -  04/13/18 0857    Consent Given?  Yes    Education Handout Provided  No    Muscles Treated Upper Body  --   anterior/ middle deltoid, bicpe brachii          PT Education - 04/13/18 0920    Education Details  progress since starting PT, updated HEp for shoulder strengthening and bicep stretching    Person(s) Educated  Patient    Methods  Explanation;Verbal cues;Handout    Comprehension  Verbalized understanding;Verbal cues required       PT Short Term Goals - 03/25/18 0951      PT SHORT TERM GOAL #1   Title  pt to be I with inital HEP    Time  2    Period  Weeks    Status  Achieved        PT Long Term Goals - 04/13/18 [id number]      PT LONG TERM GOAL #1   Title  pt to increase R shoulder strength to 5/5 in all planes with no pain reported for shoulder stability with reaching overhead    Time  4    Period  Weeks    Status  On-going      PT LONG TERM GOAL #2   Title  pt to be able to return to playing basketball and golfing with no report of pain for personal goals     Time  4    Period  Weeks    Status  On-going      PT LONG TERM GOAL #3   Title  increase FOTO score to </= 23% limited to demo improvement in function     Time  4     Period  Weeks    Status  On-going      PT LONG TERM GOAL #4   Title  pt to be I with all HEP given as of last visit to maintain and progress current level of function     Time  4    Period  Weeks    Status  On-going            Plan - 04/13/18 [id number]    Clinical Impression Statement  Mr. Dareen Pianonderson continues to make progress with strength and reporting pain decreaesd to 3/10. He is progressing appropriately with goals and additonaly reports consistency with his HEP at home. Continued TPDN along the middle / anterior deltoid and bicep brachii followed with IASTM techniques. He was able to do all exercises today reporting fatigue only. conitnued ionto for pain relief.     PT Frequency  2x / week    PT Duration  6 weeks    PT Treatment/Interventions  ADLs/Self Care Home Management;Cryotherapy;Electrical Stimulation;Iontophoresis 4mg /ml Dexamethasone;Traction;Ultrasound;Neuromuscular re-education;Therapeutic activities;Moist Heat;Therapeutic exercise;Manual techniques;Taping;Dry needling;Patient/family education    PT Next Visit Plan  ERO, update HEP, how was DN and ionto, peri-scapular strengthening, STW along anterior deltoid, Rhomboids and teres minor/ infraspinatus,     PT Home Exercise Plan  ceiling punches, lower trap wall y's, upper trap and rhomboid stretching, bicep stretch, I's T's and Y's.     Consulted and Agree with Plan of Care  Patient       Patient will benefit from skilled therapeutic intervention in order to improve the following deficits and impairments:  Pain, Impaired UE functional use, Decreased strength, Decreased knowledge of precautions, Postural dysfunction, Improper body mechanics, Decreased activity tolerance, Decreased endurance  Visit Diagnosis: Chronic right shoulder pain  Muscle weakness (generalized)  Problem List Patient Active Problem List   Diagnosis Date Noted  . Family history of diabetes mellitus 02/17/2018  . Attention deficit disorder  (ADD) without hyperactivity 02/13/2017  . Cluster headaches 06/26/2011  . Obesity, Class III, BMI 40-49.9 (morbid obesity) (HCC) 06/26/2011   Lulu Riding PT, DPT, LAT, ATC  04/13/18  9:31 AM      [facility] [address] [place], [place], [address] Phone: [phone]   Fax:  [phone]  Name: ZAYIN VALADEZ MRN: [id number] Date of Birth: 1985/11/14

## 2018-04-17 ENCOUNTER — Encounter: Payer: Self-pay | Admitting: Family Medicine

## 2018-04-20 ENCOUNTER — Encounter: Payer: Self-pay | Admitting: Family Medicine

## 2018-04-20 ENCOUNTER — Encounter: Payer: Self-pay | Admitting: Physical Therapy

## 2018-04-20 ENCOUNTER — Ambulatory Visit: Payer: Commercial Managed Care - PPO | Attending: Family Medicine | Admitting: Physical Therapy

## 2018-04-20 DIAGNOSIS — M6281 Muscle weakness (generalized): Secondary | ICD-10-CM | POA: Diagnosis not present

## 2018-04-20 DIAGNOSIS — G8929 Other chronic pain: Secondary | ICD-10-CM | POA: Diagnosis not present

## 2018-04-20 DIAGNOSIS — M25511 Pain in right shoulder: Secondary | ICD-10-CM | POA: Insufficient documentation

## 2018-04-20 MED ORDER — AMPHETAMINE-DEXTROAMPHETAMINE 20 MG PO TABS: 20.0000 mg | ORAL_TABLET | Freq: Three times a day (TID) | ORAL | 0 refills | Status: DC

## 2018-04-20 NOTE — Therapy (Signed)
[facility] Outpatient Rehabilitation [facility] [address] [place], [place], [address] Phone: [phone]   Fax:  [phone]  Physical Therapy Treatment  Patient Details  Name: Gerald Savage MRN: [id number] Date of Birth: 08-24-1985 Referring Provider (PT): Sharlot Gowda MD   Encounter Date: 04/20/2018  PT End of Session - 04/20/18 0919    Visit Number  7    Number of Visits  12    Date for PT Re-Evaluation  05/25/18    PT Start Time  0848    PT Stop Time  0936    PT Time Calculation (min)  48 min    Activity Tolerance  Patient tolerated treatment well    Behavior During Therapy  [facility] for tasks assessed/performed       Past Medical History:  Diagnosis Date  . Folliculitis   . Migraines     History reviewed. No pertinent surgical history.  There were no vitals filed for this visit.  Subjective Assessment - 04/20/18 0851    Subjective  " I am feeling like it is getting better, I haven't tried lifting my daughter as much. Sudden movements still cause soreness, and sidelying sleeping"     Patient Stated Goals  return to normal activity, playing baskebtall, decreasing pain    Currently in Pain?  Yes    Pain Score  1     Pain Location  Shoulder    Pain Orientation  Right    Pain Descriptors / Indicators  Aching;Sore    Pain Type  Chronic pain    Pain Onset  More than a month ago    Pain Frequency  Intermittent    Aggravating Factors   sleeping on the side, quick movements    Pain Relieving Factors  DN, ionto                       [facility] Adult PT Treatment/Exercise - 04/20/18 0001      Shoulder Exercises: Seated   Other Seated Exercises  shoulder flexion 2 x 12 pushing in on pilates ring, 2 x 12 pulling out on pilates ring      Shoulder Exercises: Standing   Protraction  10 reps;Other (comment)   CW/CCW    Theraband Level (Shoulder Protraction)  Other (comment)   blue weight ball   Protraction Limitations  in sustained  protraction against the wall    Diagonals  Strengthening;15 reps;Theraband   D1 and D2 R shoulder only    Theraband Level (Shoulder Diagonals)  Level 3 (Green)    Other Standing Exercises  plyometric wall bouncing 2 x 10   with elbow bent       Shoulder Exercises: ROM/Strengthening   UBE (Upper Arm Bike)  L3 x 6 min    changing direction at 3 min     Shoulder Exercises: Stretch   Other Shoulder Stretches  rhomboid stretch 2 x 30 sec, upper trap stretch    Other Shoulder Stretches  bicep stretch (3 way) 1 x 30 sec hold ea.       Moist Heat Therapy   Number Minutes Moist Heat  10 Minutes    Moist Heat Location  Shoulder   in supine              PT Short Term Goals - 03/25/18 0951      PT SHORT TERM GOAL #1   Title  pt to be I with inital HEP    Time  2  Period  Weeks    Status  Achieved        PT Long Term Goals - 04/13/18 6808      PT LONG TERM GOAL #1   Title  pt to increase R shoulder strength to 5/5 in all planes with no pain reported for shoulder stability with reaching overhead    Time  4    Period  Weeks    Status  On-going      PT LONG TERM GOAL #2   Title  pt to be able to return to playing basketball and golfing with no report of pain for personal goals     Time  4    Period  Weeks    Status  On-going      PT LONG TERM GOAL #3   Title  increase FOTO score to </= 23% limited to demo improvement in function     Time  4    Period  Weeks    Status  On-going      PT LONG TERM GOAL #4   Title  pt to be I with all HEP given as of last visit to maintain and progress current level of function     Time  4    Period  Weeks    Status  On-going            Plan - 04/20/18 0919    Clinical Impression Statement  pt continues to make progress with strength and additionally reports pain is improving. Focused on strengthening for the shoulder and scapular stabilizers which he did really well with reporting fatigue but no increase of pain.     PT  Treatment/Interventions  ADLs/Self Care Home Management;Cryotherapy;Electrical Stimulation;Iontophoresis 4mg /ml Dexamethasone;Traction;Ultrasound;Neuromuscular re-education;Therapeutic activities;Moist Heat;Therapeutic exercise;Manual techniques;Taping;Dry needling;Patient/family education    PT Next Visit Plan  ERO, update HEP, how was DN and ionto, peri-scapular strengthening, STW along anterior deltoid, Rhomboids and teres minor/ infraspinatus,     PT Home Exercise Plan  ceiling punches, lower trap wall y's, upper trap and rhomboid stretching, bicep stretch, I's T's and Y's.     Consulted and Agree with Plan of Care  Patient       Patient will benefit from skilled therapeutic intervention in order to improve the following deficits and impairments:     Visit Diagnosis: Chronic right shoulder pain  Muscle weakness (generalized)     Problem List Patient Active Problem List   Diagnosis Date Noted  . Family history of diabetes mellitus 02/17/2018  . Attention deficit disorder (ADD) without hyperactivity 02/13/2017  . Cluster headaches 06/26/2011  . Obesity, Class III, BMI 40-49.9 (morbid obesity) (HCC) 06/26/2011   Lulu Riding PT, DPT, LAT, ATC  04/20/18  9:31 AM      [facility] [address] [place], [place], [address] Phone: [phone]   Fax:  [phone]  Name: Gerald Savage MRN: [id number] Date of Birth: 11-23-1985

## 2018-04-22 ENCOUNTER — Encounter: Payer: Self-pay | Admitting: Physical Therapy

## 2018-04-22 ENCOUNTER — Ambulatory Visit: Payer: Commercial Managed Care - PPO | Admitting: Physical Therapy

## 2018-04-22 DIAGNOSIS — M25511 Pain in right shoulder: Principal | ICD-10-CM

## 2018-04-22 DIAGNOSIS — G8929 Other chronic pain: Secondary | ICD-10-CM

## 2018-04-22 DIAGNOSIS — M6281 Muscle weakness (generalized): Secondary | ICD-10-CM

## 2018-04-22 NOTE — Therapy (Signed)
[facility] Outpatient Rehabilitation [facility] [address] [place], [place], [address] Phone: [phone]   Fax:  [phone]  Physical Therapy Treatment  Patient Details  Name: Gerald Savage MRN: [id number] Date of Birth: 11/20/1985 Referring Provider (PT): Sharlot Gowda MD   Encounter Date: 04/22/2018  PT End of Session - 04/22/18 0848    Visit Number  8    Number of Visits  12    Date for PT Re-Evaluation  05/25/18    PT Start Time  0848    PT Stop Time  0936    PT Time Calculation (min)  48 min    Activity Tolerance  Patient tolerated treatment well    Behavior During Therapy  [facility] for tasks assessed/performed       Past Medical History:  Diagnosis Date  . Folliculitis   . Migraines     History reviewed. No pertinent surgical history.  There were no vitals filed for this visit.  Subjective Assessment - 04/22/18 0848    Subjective  "I didn't feel sore after the last session, it felt like a good workout" pt reports 1/10 pain possibley from sleep on the shoulder.     Currently in Pain?  Yes    Pain Score  1     Pain Location  Shoulder    Pain Orientation  Right    Pain Descriptors / Indicators  Aching;Sore    Pain Type  Chronic pain    Pain Onset  More than a month ago    Pain Frequency  Intermittent                       [facility] Adult PT Treatment/Exercise - 04/22/18 0001      Shoulder Exercises: Prone   Other Prone Exercises  protractin in high plank position 2 x 10 holding 3 sec ea.    Other Prone Exercises  I's, Y's and T's 1 x 10 ea. with 2#   prone on green physioball bil     Shoulder Exercises: ROM/Strengthening   UBE (Upper Arm Bike)  L3 x 4 min    changing direction at 2 min,    Other ROM/Strengthening Exercises  posterior deltoid strengthening on free motion 2 x 15 with 13#      Shoulder Exercises: Stretch   Other Shoulder Stretches  rhomboid stretch 2 x 30 sec, upper trap stretch      Cryotherapy   Number  Minutes Cryotherapy  10 Minutes    Cryotherapy Location  Shoulder    Type of Cryotherapy  Ice pack      Manual Therapy   Manual therapy comments  skilled palpation and monitoring of pt throughout TPDN    Soft tissue mobilization  IASTM along  proximal bicep and deltoid, infraspinatus / teres minor       Trigger Point Dry Needling - 04/22/18 0851    Consent Given?  Yes    Education Handout Provided  No   given previously   Muscles Treated Upper Body  --   bicep brachii on the R, anterior/ middle deltoid on the R            PT Short Term Goals - 03/25/18 0951      PT SHORT TERM GOAL #1   Title  pt to be I with inital HEP    Time  2    Period  Weeks    Status  Achieved  PT Long Term Goals - 04/13/18 0737      PT LONG TERM GOAL #1   Title  pt to increase R shoulder strength to 5/5 in all planes with no pain reported for shoulder stability with reaching overhead    Time  4    Period  Weeks    Status  On-going      PT LONG TERM GOAL #2   Title  pt to be able to return to playing basketball and golfing with no report of pain for personal goals     Time  4    Period  Weeks    Status  On-going      PT LONG TERM GOAL #3   Title  increase FOTO score to </= 23% limited to demo improvement in function     Time  4    Period  Weeks    Status  On-going      PT LONG TERM GOAL #4   Title  pt to be I with all HEP given as of last visit to maintain and progress current level of function     Time  4    Period  Weeks    Status  On-going            Plan - 04/22/18 1062    Clinical Impression Statement  continued TPDN along the anterior and middle deltoid and bicep followed with IASTM tecniques. continued stretching and progressing shoulder strengthening which pt reported fatigue and reduction of stiffness. utilized ice pack end of session to calm down soreness post session.     PT Next Visit Plan  DN PRN, peri-scapular strengthening, STW along anterior deltoid,  Rhomboids and teres minor/ infraspinatus,     PT Home Exercise Plan  ceiling punches, lower trap wall y's, upper trap and rhomboid stretching, bicep stretch, I's T's and Y's.     Consulted and Agree with Plan of Care  Patient       Patient will benefit from skilled therapeutic intervention in order to improve the following deficits and impairments:  Pain, Impaired UE functional use, Decreased strength, Decreased knowledge of precautions, Postural dysfunction, Improper body mechanics, Decreased activity tolerance, Decreased endurance  Visit Diagnosis: Chronic right shoulder pain  Muscle weakness (generalized)     Problem List Patient Active Problem List   Diagnosis Date Noted  . Family history of diabetes mellitus 02/17/2018  . Attention deficit disorder (ADD) without hyperactivity 02/13/2017  . Cluster headaches 06/26/2011  . Obesity, Class III, BMI 40-49.9 (morbid obesity) (HCC) 06/26/2011   Lulu Riding PT, DPT, LAT, ATC  04/22/18  9:29 AM      [facility] [address] [place], [place], [address] Phone: [phone]   Fax:  [phone]  Name: Gerald Savage MRN: [id number] Date of Birth: 09-Apr-1986

## 2018-04-27 ENCOUNTER — Ambulatory Visit: Payer: Commercial Managed Care - PPO | Admitting: Physical Therapy

## 2018-05-07 ENCOUNTER — Ambulatory Visit: Payer: Commercial Managed Care - PPO | Admitting: Physical Therapy

## 2018-05-07 ENCOUNTER — Encounter: Payer: Self-pay | Admitting: Physical Therapy

## 2018-05-07 DIAGNOSIS — M25511 Pain in right shoulder: Principal | ICD-10-CM

## 2018-05-07 DIAGNOSIS — M6281 Muscle weakness (generalized): Secondary | ICD-10-CM

## 2018-05-07 DIAGNOSIS — G8929 Other chronic pain: Secondary | ICD-10-CM | POA: Diagnosis not present

## 2018-05-07 NOTE — Therapy (Signed)
[facility] Outpatient Rehabilitation [facility] [address] [place], [place], [address] Phone: [phone]   Fax:  [phone]  Physical Therapy Treatment  Patient Details  Name: Gerald Savage MRN: [id number] Date of Birth: [birth year] Referring Provider (PT): Sharlot Gowda MD   Encounter Date: 05/07/2018  PT End of Session - 05/07/18 0932    Visit Number  9    Number of Visits  12    Date for PT Re-Evaluation  05/25/18    PT Start Time  0933    PT Stop Time  1021    PT Time Calculation (min)  48 min    Activity Tolerance  Patient tolerated treatment well    Behavior During Therapy  [facility] for tasks assessed/performed       Past Medical History:  Diagnosis Date  . Folliculitis   . Migraines     History reviewed. No pertinent surgical history.  There were no vitals filed for this visit.  Subjective Assessment - 05/07/18 0936    Subjective  "I've been doing better, I did notice over the weekend I did get a twinge on Saturday that lingered"    Patient Stated Goals  return to normal activity, playing baskebtall, decreasing pain    Currently in Pain?  Yes    Pain Score  1     Pain Location  Shoulder    Pain Orientation  Right    Pain Descriptors / Indicators  Aching;Sore    Pain Type  Chronic pain    Pain Onset  More than a month ago    Pain Frequency  Intermittent    Aggravating Factors   quick motion.     Pain Relieving Factors  DN, ionto, exercise                       [facility] Adult PT Treatment/Exercise - 05/07/18 0939      Shoulder Exercises: Seated   Other Seated Exercises  1/2 kneeling on airex tossing red ball back over shoulder and catching and controlling eccentric loading cross body 1 x 10      Shoulder Exercises: Prone   Other Prone Exercises  protractin in high plank position 1 x 20 from table, 2 x10 from reverse bosu      Shoulder Exercises: Standing   Other Standing Exercises  lower trap strengthening with pilates  cirlce pushing out and shoulder flexion      Shoulder Exercises: ROM/Strengthening   UBE (Upper Arm Bike)  L6 x 6 min    changing direction at 3 min     Shoulder Exercises: Body Blade   Flexion  30 seconds;2 reps    Other Body Blade Exercises  IR 2 x 1 min, D2 1 x 1 min RUE    Other Body Blade Exercises  elbow flexion/ extension 2 x 1 min      Cryotherapy   Number Minutes Cryotherapy  10 Minutes    Cryotherapy Location  Shoulder    Type of Cryotherapy  Ice pack               PT Short Term Goals - 03/25/18 0951      PT SHORT TERM GOAL #1   Title  pt to be I with inital HEP    Time  2    Period  Weeks    Status  Achieved        PT Long Term Goals - 04/13/18 4599  PT LONG TERM GOAL #1   Title  pt to increase R shoulder strength to 5/5 in all planes with no pain reported for shoulder stability with reaching overhead    Time  4    Period  Weeks    Status  On-going      PT LONG TERM GOAL #2   Title  pt to be able to return to playing basketball and golfing with no report of pain for personal goals     Time  4    Period  Weeks    Status  On-going      PT LONG TERM GOAL #3   Title  increase FOTO score to </= 23% limited to demo improvement in function     Time  4    Period  Weeks    Status  On-going      PT LONG TERM GOAL #4   Title  pt to be I with all HEP given as of last visit to maintain and progress current level of function     Time  4    Period  Weeks    Status  On-going            Plan - 05/07/18 1012    Clinical Impression Statement  Focused session on shoulder strengthening with increased reps to promote endurance and progressing to quick movements which he did well with minimal verbal cues for proper form. pt continued ice end of session to calm down muscle soreness but otherwise reported feeling good. If pt is doing well plan to potentiall discharge next visit.     PT Treatment/Interventions  ADLs/Self Care Home  Management;Cryotherapy;Electrical Stimulation;Iontophoresis 4mg /ml Dexamethasone;Traction;Ultrasound;Neuromuscular re-education;Therapeutic activities;Moist Heat;Therapeutic exercise;Manual techniques;Taping;Dry needling;Patient/family education    PT Next Visit Plan  DN PRN, peri-scapular strengthening, STW along anterior deltoid, Rhomboids and teres minor/ infraspinatus, potential discharge    PT Home Exercise Plan  ceiling punches, lower trap wall y's, upper trap and rhomboid stretching, bicep stretch, I's T's and Y's.     Consulted and Agree with Plan of Care  Patient       Patient will benefit from skilled therapeutic intervention in order to improve the following deficits and impairments:  Pain, Impaired UE functional use, Decreased strength, Decreased knowledge of precautions, Postural dysfunction, Improper body mechanics, Decreased activity tolerance, Decreased endurance  Visit Diagnosis: Chronic right shoulder pain  Muscle weakness (generalized)     Problem List Patient Active Problem List   Diagnosis Date Noted  . Family history of diabetes mellitus 02/17/2018  . Attention deficit disorder (ADD) without hyperactivity 02/13/2017  . Cluster headaches 06/26/2011  . Obesity, Class III, BMI 40-49.9 (morbid obesity) (HCC) 06/26/2011   Lulu Riding PT, DPT, LAT, ATC  05/07/18  10:14 AM      [facility] Outpatient [facility] [address] [place], [place], [address] Phone: [phone]   Fax:  [phone]  Name: Gerald Savage MRN: [id number] Date of Birth: [birth year]

## 2018-05-11 ENCOUNTER — Encounter: Payer: Self-pay | Admitting: Physical Therapy

## 2018-05-11 ENCOUNTER — Ambulatory Visit: Payer: Commercial Managed Care - PPO | Admitting: Physical Therapy

## 2018-05-11 DIAGNOSIS — M6281 Muscle weakness (generalized): Secondary | ICD-10-CM

## 2018-05-11 DIAGNOSIS — G8929 Other chronic pain: Secondary | ICD-10-CM

## 2018-05-11 DIAGNOSIS — M25511 Pain in right shoulder: Principal | ICD-10-CM

## 2018-05-11 NOTE — Therapy (Signed)
[place], [place], [address] Phone: [phone]   Fax:  [phone]  Physical Therapy Treatment / Discharge Summary  Patient Details  Name: Gerald Savage MRN: [id number] Date of Birth: [birth year] Referring Provider (PT): Gerald Savage   Encounter Date: 05/11/2018  PT End of Session - 05/11/18 0849    Visit Number  10    Number of Visits  12    Date for PT Re-Evaluation  05/25/18    PT Start Time  0850    PT Stop Time  0918   shortened session due to discharge   PT Time Calculation (min)  28 min    Activity Tolerance  Patient tolerated treatment well    Behavior During Therapy  [facility] for tasks assessed/performed       Past Medical History:  Diagnosis Date  . Folliculitis   . Migraines     History reviewed. No pertinent surgical history.  There were no vitals filed for this visit.  Subjective Assessment - 05/11/18 0852    Subjective  "the shoulder has been sore likely from the exercise"     Currently in Pain?  Yes    Pain Score  1     Pain Location  Shoulder    Pain Orientation  Right    Pain Descriptors / Indicators  Sore         [facility] PT Assessment - 05/11/18 0854      Observation/Other Assessments   Focus on Therapeutic Outcomes (FOTO)   13% limited      Strength   Right Shoulder Flexion  4+/5    Right Shoulder Extension  5/5    Right Shoulder ABduction  4+/5    Right Shoulder Internal Rotation  5/5    Right Shoulder External Rotation  5/5    Right Hand Grip (lbs)  113.3   125,110,105                  [facility] Adult PT Treatment/Exercise - 05/11/18 0001      Shoulder Exercises: Standing   Other Standing Exercises  lower trap strengthening with green theraband around hands sliding up the wall 1 x 10    Other Standing Exercises  scapular retraction with ER bil, 2 x 10   1 set with blue band, 1 set with black band     Shoulder Exercises: Body Blade   Flexion  Limitations  bicep brachii, and brachialis 2 x 30 sec    Other Body Blade Exercises  anterior/ middle and posterior deltoid 2 x 30 sec each             PT Education - 05/11/18 0909    Education Details  reviewed previously provided HEP and benefits of conintued strengthening. progressing reps/ sets to increase strength/ endurance.     Person(s) Educated  Patient    Methods  Explanation;Verbal cues    Comprehension  Verbalized understanding;Verbal cues required       PT Short Term Goals - 03/25/18 0951      PT SHORT TERM GOAL #1   Title  pt to be I with inital HEP    Time  2    Period  Weeks    Status  Achieved        PT Long Term Goals - 05/11/18 0857      PT LONG TERM GOAL #1   Title  pt to increase R shoulder strength to 5/5 in all planes  with no pain reported for shoulder stability with reaching overhead    Baseline  4+/5 with flexion/ abduction    Time  4    Period  Weeks    Status  Partially Met      PT LONG TERM GOAL #2   Title  pt to be able to return to playing basketball and golfing with no report of pain for personal goals     Status  Achieved      PT LONG TERM GOAL #3   Title  increase FOTO score to </= 23% limited to demo improvement in function     Period  Weeks    Status  Achieved      PT LONG TERM GOAL #4   Title  pt to be I with all HEP given as of last visit to maintain and progress current level of function     Time  4    Period  Weeks    Status  Achieved            Plan - 05/11/18 0814    Clinical Impression Statement  Gerald Savage has made great progress with physical therapy increasing shoulder strength and additionally reports no pain. He was able to do all exercises today with no report of pain just fatigue. He met all goals today and is able to maintain and progress current level of function independently and will be discharged from PT today.     PT Treatment/Interventions  ADLs/Self Care Home Management;Cryotherapy;Electrical  Stimulation;Iontophoresis 13m/ml Dexamethasone;Traction;Ultrasound;Neuromuscular re-education;Therapeutic activities;Moist Heat;Therapeutic exercise;Manual techniques;Taping;Dry needling;Patient/family education    PT Next Visit Plan  D/C    PT Home Exercise Plan  ceiling punches, lower trap wall y's, upper trap and rhomboid stretching, bicep stretch, I's T's and Y's.     Consulted and Agree with Plan of Care  Patient       Patient will benefit from skilled therapeutic intervention in order to improve the following deficits and impairments:  Pain, Impaired UE functional use, Decreased strength, Decreased knowledge of precautions, Postural dysfunction, Improper body mechanics, Decreased activity tolerance, Decreased endurance  Visit Diagnosis: Chronic right shoulder pain  Muscle weakness (generalized)     Problem List Patient Active Problem List   Diagnosis Date Noted  . Family history of diabetes mellitus 02/17/2018  . Attention deficit disorder (ADD) without hyperactivity 02/13/2017  . Cluster headaches 06/26/2011  . Obesity, Class III, BMI 40-49.9 (morbid obesity) ([place] 06/26/2011    LStarr Lake1/27/2020, 9:22 AM  [facility] [address]Phone: [phone]  Fax:  [phone] Name: Gerald MCMANUSMRN: [id number] of Birth: 110-10-1985     PHYSICAL THERAPY DISCHARGE SUMMARY  Visits from Start of Care: 10  Current functional level related to goals / functional outcomes: See goals, FOTO score 13% limitation   Remaining deficits: Fatigues with overhead strengthening but otherwise no deficits noted. See assessment.    Education / Equipment: HEP, theraband,  Proper posture and lifting mechanics  Plan: Patient agrees to discharge.  Patient goals were met. Patient is being discharged due to meeting the stated rehab goals.  ?????           Kerrilynn Derenzo PT,  DPT, LAT, ATC  05/11/18  9:25 AM

## 2018-05-18 ENCOUNTER — Encounter: Payer: Commercial Managed Care - PPO | Admitting: Physical Therapy

## 2018-08-19 ENCOUNTER — Encounter: Payer: Self-pay | Admitting: Family Medicine

## 2018-08-19 MED ORDER — AMPHETAMINE-DEXTROAMPHETAMINE 20 MG PO TABS: 20.0000 mg | ORAL_TABLET | Freq: Three times a day (TID) | ORAL | 0 refills | Status: DC

## 2019-01-22 ENCOUNTER — Encounter: Payer: Self-pay | Admitting: Family Medicine

## 2019-01-22 MED ORDER — AMPHETAMINE-DEXTROAMPHETAMINE 20 MG PO TABS: 20.0000 mg | ORAL_TABLET | Freq: Three times a day (TID) | ORAL | 0 refills | Status: DC

## 2019-02-16 ENCOUNTER — Encounter: Payer: Self-pay | Admitting: Family Medicine

## 2019-02-22 ENCOUNTER — Encounter: Payer: Self-pay | Admitting: Family Medicine

## 2019-02-22 ENCOUNTER — Other Ambulatory Visit: Payer: Self-pay

## 2019-02-22 ENCOUNTER — Ambulatory Visit (INDEPENDENT_AMBULATORY_CARE_PROVIDER_SITE_OTHER): Payer: Commercial Managed Care - PPO | Admitting: Family Medicine

## 2019-02-22 VITALS — BP 110/74 | HR 95 | Temp 98.2°F | Ht 74.0 in | Wt 283.2 lb

## 2019-02-22 DIAGNOSIS — F988 Other specified behavioral and emotional disorders with onset usually occurring in childhood and adolescence: Secondary | ICD-10-CM

## 2019-02-22 DIAGNOSIS — Z23 Encounter for immunization: Secondary | ICD-10-CM | POA: Diagnosis not present

## 2019-02-22 DIAGNOSIS — Z833 Family history of diabetes mellitus: Secondary | ICD-10-CM | POA: Diagnosis not present

## 2019-02-22 DIAGNOSIS — G44019 Episodic cluster headache, not intractable: Secondary | ICD-10-CM | POA: Diagnosis not present

## 2019-02-22 DIAGNOSIS — R634 Abnormal weight loss: Secondary | ICD-10-CM

## 2019-02-22 DIAGNOSIS — Z0000 Encounter for general adult medical examination without abnormal findings: Secondary | ICD-10-CM

## 2019-02-22 LAB — POCT URINALYSIS DIP (PROADVANTAGE DEVICE)
Bilirubin, UA: NEGATIVE
Blood, UA: NEGATIVE
Glucose, UA: NEGATIVE mg/dL
Ketones, POC UA: NEGATIVE mg/dL
Leukocytes, UA: NEGATIVE
Nitrite, UA: NEGATIVE
Specific Gravity, Urine: 1.025
Urobilinogen, Ur: 0.2
pH, UA: 6 (ref 5.0–8.0)

## 2019-02-22 MED ORDER — AMPHETAMINE-DEXTROAMPHETAMINE 20 MG PO TABS: 20.0000 mg | ORAL_TABLET | Freq: Three times a day (TID) | ORAL | 0 refills | Status: DC

## 2019-02-22 NOTE — Progress Notes (Signed)
   Subjective:    Patient ID: Gerald Savage, male    DOB: May 18, 1985, 33 y.o.   MRN: [id number]  HPI He is here for complete examination.  He did have recent blood work done.  I reviewed it with him.  He has had a hep C screen which was negative.  He has a history of cluster headaches but has had none recently.  He continues to do quite nicely on his ADD medication.  Each pill lasts roughly 3 to 4 hours.  He can tell when it starts to work and when it wears off.  He is very happy with this.  He sometimes skips pills on the weekends.  He is continue to work on his weight loss by making dietary changes.  He keeps himself physically active.  His marriage is going well.  He has a 55-year-old daughter.  He does have a family history of diabetes.  Family and social history as well as health maintenance and immunizations was reviewed   Review of Systems  All other systems reviewed and are negative.      Objective:   Physical Exam Alert and in no distress. Tympanic membranes and canals are normal. Pharyngeal area is normal. Neck is supple without adenopathy or thyromegaly. Cardiac exam shows a regular sinus rhythm without murmurs or gallops. Lungs are clear to auscultation. Abdominal exam shows no masses or tenderness with normal bowel sounds.  [name] shows normal circumcised male.  Testes normal.       Assessment & Plan:  Routine general medical examination at health care facility - Plan: POCT Urinalysis DIP (Proadvantage Device)  Weight loss - Plan: Nutrition  Attention deficit disorder (ADD) without hyperactivity - Plan: amphetamine-dextroamphetamine (ADDERALL) 20 MG tablet, amphetamine-dextroamphetamine (ADDERALL) 20 MG tablet, amphetamine-dextroamphetamine (ADDERALL) 20 MG tablet  Episodic cluster headache, not intractable  Family history of diabetes mellitus  Need for influenza vaccination - Plan: Flu Vaccine QUAD 6+ mos PF IM (Fluarix Quad PF) He expressed interest in getting  some nutritional counseling and I will therefore make that.  Discussed the need for him to work on a particular waist size rather than weight.

## 2019-06-23 ENCOUNTER — Encounter: Payer: Self-pay | Admitting: Family Medicine

## 2019-06-23 DIAGNOSIS — F988 Other specified behavioral and emotional disorders with onset usually occurring in childhood and adolescence: Secondary | ICD-10-CM

## 2019-06-23 MED ORDER — AMPHETAMINE-DEXTROAMPHETAMINE 20 MG PO TABS: 20.0000 mg | ORAL_TABLET | Freq: Three times a day (TID) | ORAL | 0 refills | Status: DC

## 2019-11-10 ENCOUNTER — Telehealth: Payer: Self-pay

## 2019-11-10 DIAGNOSIS — F988 Other specified behavioral and emotional disorders with onset usually occurring in childhood and adolescence: Secondary | ICD-10-CM

## 2019-11-10 MED ORDER — AMPHETAMINE-DEXTROAMPHETAMINE 20 MG PO TABS: 20.0000 mg | ORAL_TABLET | Freq: Three times a day (TID) | ORAL | 0 refills | Status: DC

## 2019-11-10 NOTE — Telephone Encounter (Signed)
Received a fax from [facility] pharmacy for a refill on the pts. Adderall  Pt. Last apt was 02/22/19 and next apt. Is 02/28/20.

## 2020-01-20 IMAGING — DX DG CHEST 2V
2 series · 2 of 2 positions shown · non-contrast
Comparison: 07/03/2011

CLINICAL DATA: Chest tightness for 1 year.

EXAM:
CHEST  2 VIEW

[chest pa]
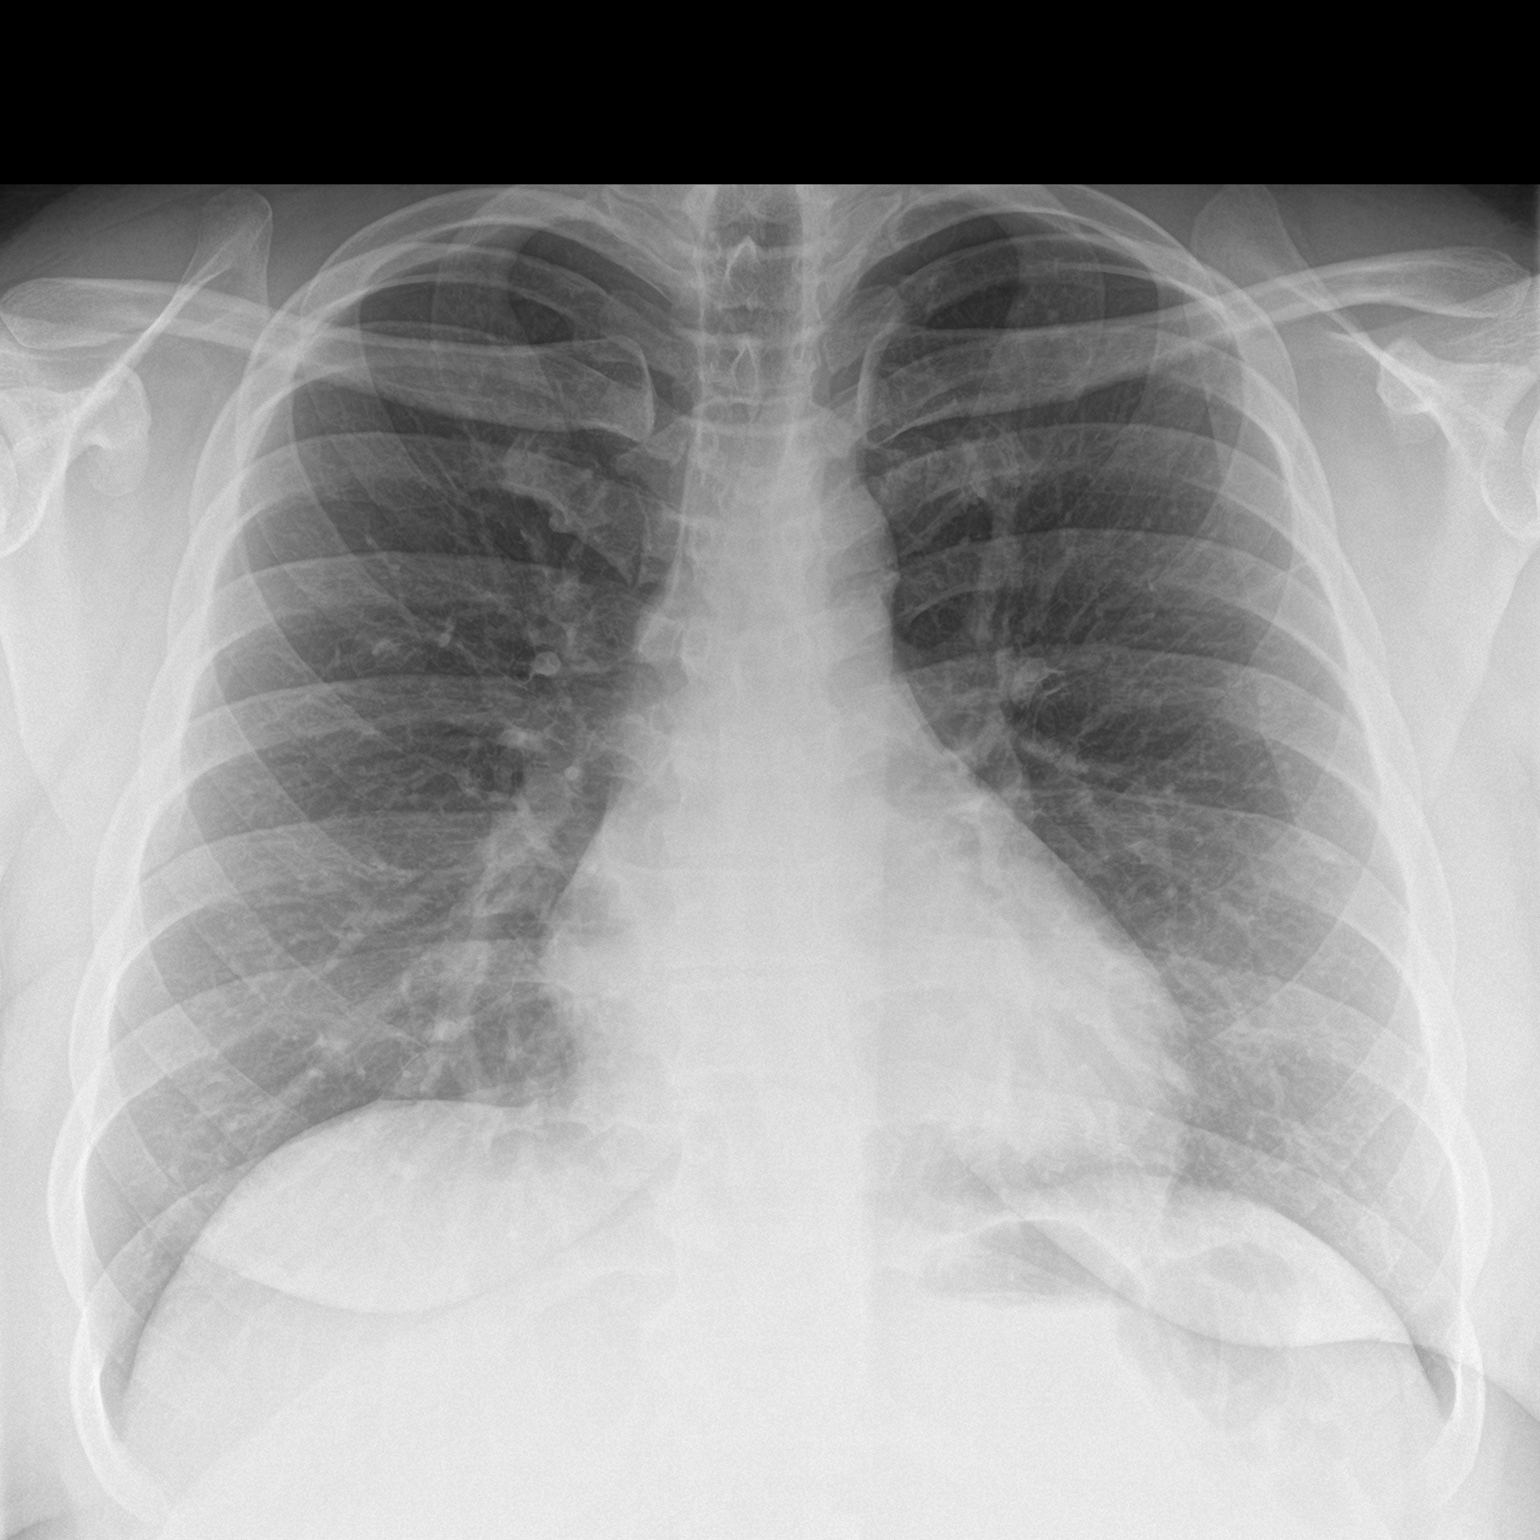

[chest lat]
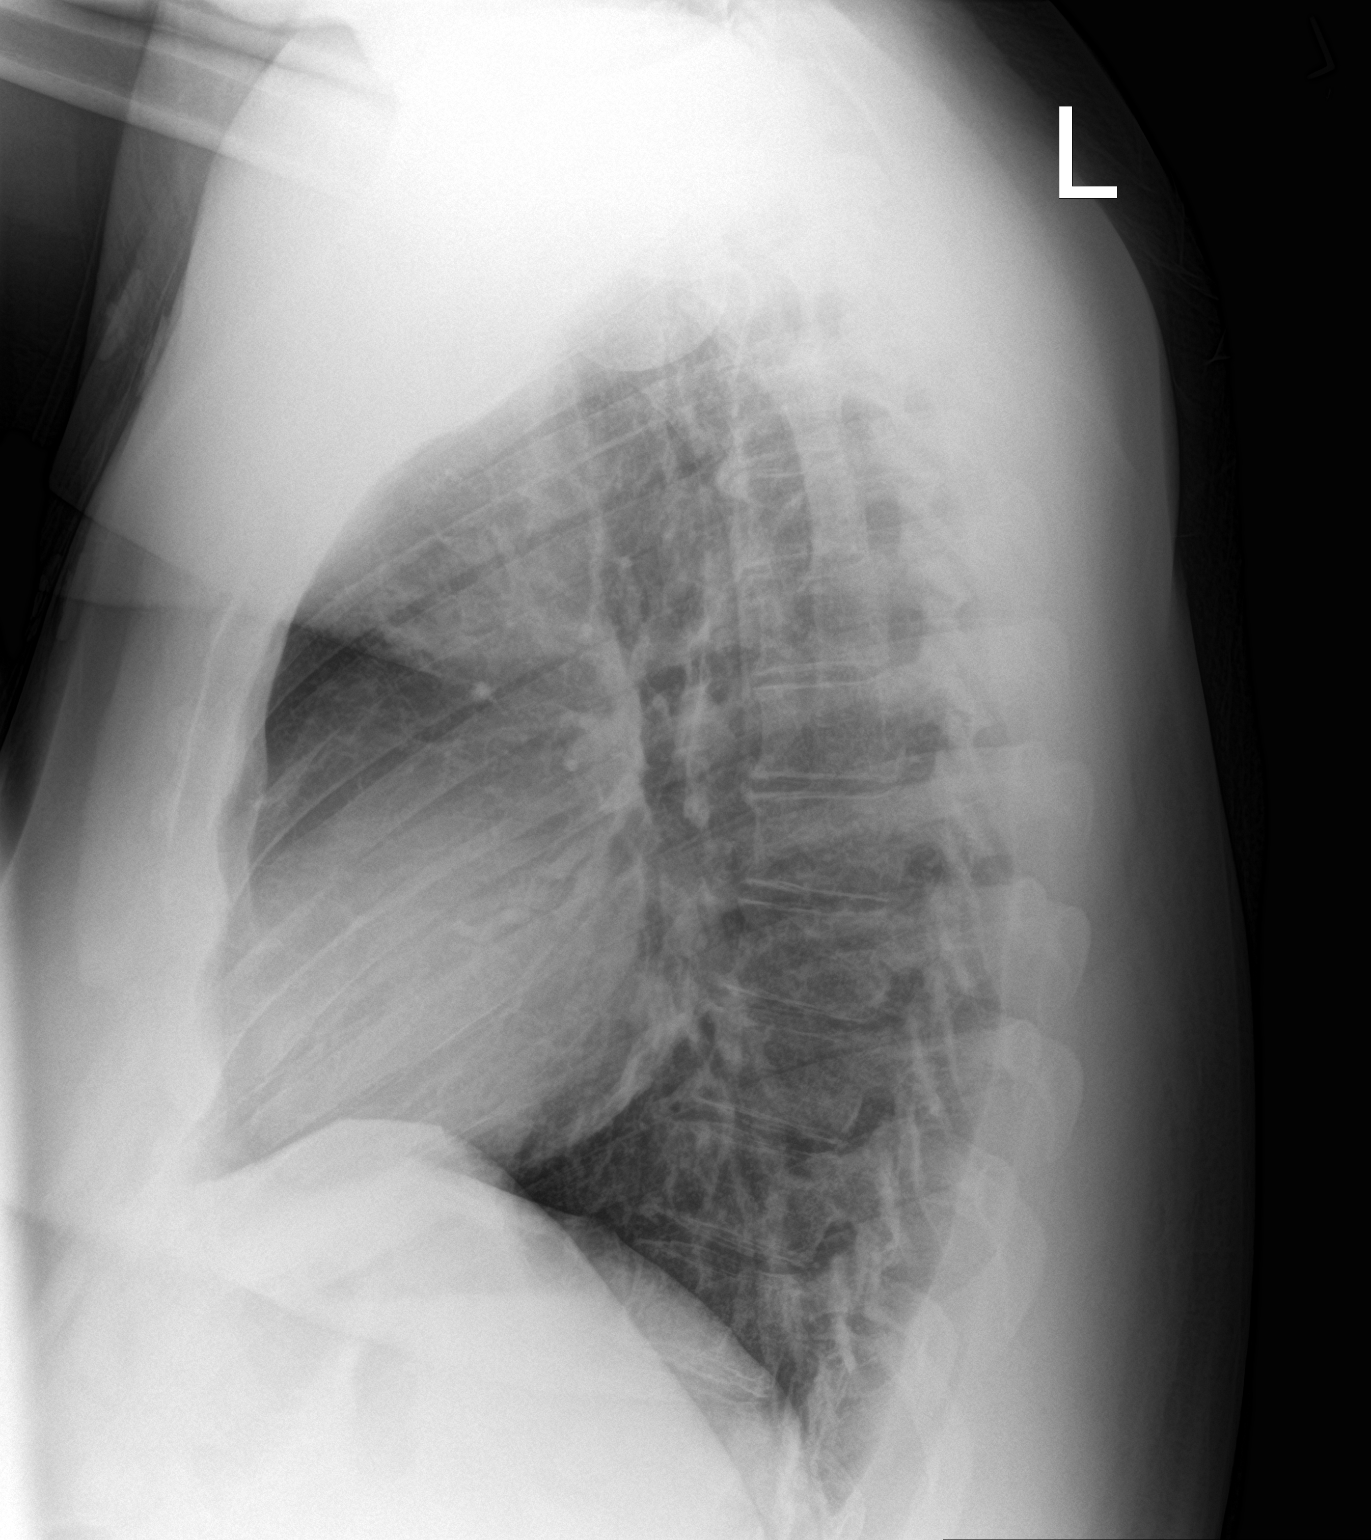

[2 of 2 positions shown; findings below may reference images not displayed]

FINDINGS: The heart size and mediastinal contours are within normal limits.
Both lungs are clear. The visualized skeletal structures are
unremarkable.
IMPRESSION: No active cardiopulmonary disease.

## 2020-02-21 ENCOUNTER — Encounter: Payer: Self-pay | Admitting: Family Medicine

## 2020-02-28 ENCOUNTER — Encounter: Payer: Commercial Managed Care - PPO | Admitting: Family Medicine

## 2020-03-01 ENCOUNTER — Other Ambulatory Visit: Payer: Self-pay

## 2020-03-01 ENCOUNTER — Encounter: Payer: Self-pay | Admitting: Family Medicine

## 2020-03-01 ENCOUNTER — Ambulatory Visit (INDEPENDENT_AMBULATORY_CARE_PROVIDER_SITE_OTHER): Payer: Commercial Managed Care - PPO | Admitting: Family Medicine

## 2020-03-01 VITALS — BP 106/70 | HR 81 | Temp 98.0°F | Ht 74.0 in | Wt 293.4 lb

## 2020-03-01 DIAGNOSIS — F988 Other specified behavioral and emotional disorders with onset usually occurring in childhood and adolescence: Secondary | ICD-10-CM

## 2020-03-01 DIAGNOSIS — Z1159 Encounter for screening for other viral diseases: Secondary | ICD-10-CM

## 2020-03-01 DIAGNOSIS — Z0000 Encounter for general adult medical examination without abnormal findings: Secondary | ICD-10-CM

## 2020-03-01 DIAGNOSIS — Z23 Encounter for immunization: Secondary | ICD-10-CM

## 2020-03-01 DIAGNOSIS — Z833 Family history of diabetes mellitus: Secondary | ICD-10-CM | POA: Diagnosis not present

## 2020-03-01 DIAGNOSIS — E66813 Obesity, class 3: Secondary | ICD-10-CM

## 2020-03-01 NOTE — Progress Notes (Signed)
   Subjective:    Patient ID: Gerald Savage, male    DOB: 1985/05/10, 34 y.o.   MRN: [id number]  HPI He is here for complete examination.  He did have a good job of taking care of his weight until the last several months and he now realizes the need to get back on his exercise program as well as dietary modification.  He does have underlying ADD and is doing quite nicely on his present Adderall dosing regimen.  His work and home life are going well.  He does have a 41-year-old child.  He would like a booster for Covid.  He does work a lot with [employer].  Family and social history as well as health maintenance and immunizations was reviewed   Review of Systems  All other systems reviewed and are negative.      Objective:   Physical Exam Alert and in no distress. Tympanic membranes and canals are normal. Pharyngeal area is normal. Neck is supple without adenopathy or thyromegaly. Cardiac exam shows a regular sinus rhythm without murmurs or gallops. Lungs are clear to auscultation.  Abdominal exam shows normal bowel sounds without masses or tenderness.        Assessment & Plan:  Routine general medical examination at health care facility - Plan: CBC with Differential/Platelet, Comprehensive metabolic panel, Lipid panel  Attention deficit disorder (ADD) without hyperactivity  Family history of diabetes mellitus  Need for influenza vaccination - Plan: Flu Vaccine QUAD 36+ mos IM  Immunization, viral disease - Plan: Pfizer SARS-COV-2 Vaccine  Obesity, Class III, BMI 40-49.9 (morbid obesity) (HCC) - Plan: CBC with Differential/Platelet, Comprehensive metabolic panel, Lipid panel  Need for hepatitis C screening test - Plan: Hepatitis C antibody  Encouraged him to continue with his diet and exercise regimen.  He will call when he needs a refill on his ADD medication.

## 2020-03-02 LAB — COMPREHENSIVE METABOLIC PANEL
ALT: 19 IU/L (ref 0–44)
AST: 23 IU/L (ref 0–40)
Albumin/Globulin Ratio: 1.5 (ref 1.2–2.2)
Albumin: 4.4 g/dL (ref 4.0–5.0)
Alkaline Phosphatase: 58 IU/L (ref 44–121)
BUN/Creatinine Ratio: 12 (ref 9–20)
BUN: 11 mg/dL (ref 6–20)
Bilirubin Total: 0.6 mg/dL (ref 0.0–1.2)
CO2: 25 mmol/L (ref 20–29)
Calcium: 9.6 mg/dL (ref 8.7–10.2)
Chloride: 104 mmol/L (ref 96–106)
Creatinine, Ser: 0.89 mg/dL (ref 0.76–1.27)
GFR calc Af Amer: 129 mL/min/{1.73_m2} (ref >59)
GFR calc non Af Amer: 112 mL/min/{1.73_m2} (ref >59)
Globulin, Total: 3 g/dL (ref 1.5–4.5)
Glucose: 81 mg/dL (ref 65–99)
Potassium: 4.7 mmol/L (ref 3.5–5.2)
Sodium: 141 mmol/L (ref 134–144)
Total Protein: 7.4 g/dL (ref 6.0–8.5)

## 2020-03-02 LAB — CBC WITH DIFFERENTIAL/PLATELET
Basophils Absolute: 0 10*3/uL (ref 0.0–0.2)
Basos: 1 %
EOS (ABSOLUTE): 0.1 10*3/uL (ref 0.0–0.4)
Eos: 2 %
Hematocrit: 44.4 % (ref 37.5–51.0)
Hemoglobin: 15.3 g/dL (ref 13.0–17.7)
Immature Grans (Abs): 0 10*3/uL (ref 0.0–0.1)
Immature Granulocytes: 0 %
Lymphocytes Absolute: 1.9 10*3/uL (ref 0.7–3.1)
Lymphs: 43 %
MCH: 30.4 pg (ref 26.6–33.0)
MCHC: 34.5 g/dL (ref 31.5–35.7)
MCV: 88 fL (ref 79–97)
Monocytes Absolute: 0.3 10*3/uL (ref 0.1–0.9)
Monocytes: 6 %
Neutrophils Absolute: 2.2 10*3/uL (ref 1.4–7.0)
Neutrophils: 48 %
Platelets: 271 10*3/uL (ref 150–450)
RBC: 5.03 x10E6/uL (ref 4.14–5.80)
RDW: 12.5 % (ref 11.6–15.4)
WBC: 4.5 10*3/uL (ref 3.4–10.8)

## 2020-03-02 LAB — LIPID PANEL
Chol/HDL Ratio: 2.4 ratio (ref 0.0–5.0)
Cholesterol, Total: 152 mg/dL (ref 100–199)
HDL: 64 mg/dL (ref >39)
LDL Chol Calc (NIH): 78 mg/dL (ref 0–99)
Triglycerides: 48 mg/dL (ref 0–149)
VLDL Cholesterol Cal: 10 mg/dL (ref 5–40)

## 2020-03-02 LAB — HEPATITIS C ANTIBODY: Hep C Virus Ab: <0.1 s/co ratio (ref 0.0–0.9)

## 2020-03-30 ENCOUNTER — Other Ambulatory Visit: Payer: Self-pay | Admitting: Family Medicine

## 2020-03-30 ENCOUNTER — Encounter: Payer: Self-pay | Admitting: Family Medicine

## 2020-03-30 DIAGNOSIS — F988 Other specified behavioral and emotional disorders with onset usually occurring in childhood and adolescence: Secondary | ICD-10-CM

## 2020-03-30 MED ORDER — AMPHETAMINE-DEXTROAMPHETAMINE 20 MG PO TABS: 20.0000 mg | ORAL_TABLET | Freq: Three times a day (TID) | ORAL | 0 refills | Status: DC

## 2020-04-06 NOTE — Telephone Encounter (Signed)
Not sure what this is.  Help me figure it out

## 2020-04-11 ENCOUNTER — Telehealth: Payer: Self-pay

## 2020-04-11 NOTE — Telephone Encounter (Signed)
This required a prior authorization, which was done

## 2020-04-11 NOTE — Telephone Encounter (Signed)
P.A. approved, went thru at pharmacy, pt informed

## 2020-04-11 NOTE — Telephone Encounter (Signed)
P.A. AMPHETAMINE-DEXTRO  

## 2020-08-14 ENCOUNTER — Encounter: Payer: Self-pay | Admitting: Family Medicine

## 2020-08-14 DIAGNOSIS — F988 Other specified behavioral and emotional disorders with onset usually occurring in childhood and adolescence: Secondary | ICD-10-CM

## 2020-08-14 MED ORDER — AMPHETAMINE-DEXTROAMPHETAMINE 20 MG PO TABS: 20.0000 mg | ORAL_TABLET | Freq: Three times a day (TID) | ORAL | 0 refills | Status: DC

## 2020-11-10 ENCOUNTER — Other Ambulatory Visit: Payer: Self-pay

## 2020-11-10 ENCOUNTER — Encounter: Payer: Self-pay | Admitting: Family Medicine

## 2020-11-10 ENCOUNTER — Ambulatory Visit (INDEPENDENT_AMBULATORY_CARE_PROVIDER_SITE_OTHER): Payer: Commercial Managed Care - PPO | Admitting: Family Medicine

## 2020-11-10 VITALS — BP 120/80 | HR 100 | Temp 98.5°F | Wt 298.4 lb

## 2020-11-10 DIAGNOSIS — M25562 Pain in left knee: Secondary | ICD-10-CM

## 2020-11-10 NOTE — Progress Notes (Signed)
Left knee  Subjective:    Patient ID: Gerald Savage, male    DOB: Jan 26, 1986, 35 y.o.   MRN: [id number]  HPI He injured his left knee and ankle hyperextension type injury on July 20 when he was in [place].  He did note some pain with walking as well as some swelling.  He does have a previous history of MCL sprain in 2009 and says it feels similar.  He does not complain of any laxity or instability.   Review of Systems     Objective:   Physical Exam Left knee exam does show very minimal effusion.  No joint line tenderness.  ACL is normal.  Medial collateral ligament normal.  Lateral collateral ligament was slightly lax at 30 degrees and more tight at full extension.  McMurray's testing negative.       Assessment & Plan:  Acute pain of left knee - Plan: DG Knee Complete 4 Views Left I explained that the mechanism of injury is such that he probably contused the posterior aspect of his knee.  We will get an x-ray to check for any bony abnormalities otherwise I will see him roughly 3 weeks.  We checked the lateral collateral ligament at that time.  May possibly refer to physical therapy.

## 2020-11-13 ENCOUNTER — Ambulatory Visit
Admission: RE | Admit: 2020-11-13 | Discharge: 2020-11-13 | Disposition: A | Discharge status: Home / Self Care | Payer: Commercial Managed Care - PPO | Source: Ambulatory Visit | Attending: Family Medicine | Admitting: Family Medicine

## 2020-11-30 ENCOUNTER — Other Ambulatory Visit: Payer: Self-pay

## 2020-11-30 ENCOUNTER — Ambulatory Visit (INDEPENDENT_AMBULATORY_CARE_PROVIDER_SITE_OTHER): Payer: Commercial Managed Care - PPO | Admitting: Family Medicine

## 2020-11-30 VITALS — BP 120/74 | HR 85 | Temp 96.7°F | Wt 301.0 lb

## 2020-11-30 DIAGNOSIS — M25562 Pain in left knee: Secondary | ICD-10-CM

## 2020-11-30 NOTE — Progress Notes (Signed)
   Subjective:    Patient ID: Gerald Savage, male    DOB: [birth year], 35 y.o.   MRN: [id number]  HPI He is here for a recheck.  He states that he is roughly 80% better having only minimal pain.  He would like to get back to playing basketball again.   Review of Systems     Objective:   Physical Exam Left knee exam shows no palpable tenderness or effusion.  Medial collateral ligament no longer shows any laxity.  Medial collateral ligament normal.  Negative anterior drawer.  Murray's testing negative.       Assessment & Plan:  Acute pain of left knee - Plan: Ambulatory referral to Physical Therapy I explained that I would like to have him get involved in a good knee rehab program especially since he wants to get back to playing basketball again to give him full opportunity to be returned to normal physical activity.  He was comfortable with that.

## 2021-01-19 ENCOUNTER — Encounter: Payer: Self-pay | Admitting: Family Medicine

## 2021-01-19 ENCOUNTER — Other Ambulatory Visit: Payer: Self-pay | Admitting: Family Medicine

## 2021-01-19 DIAGNOSIS — F988 Other specified behavioral and emotional disorders with onset usually occurring in childhood and adolescence: Secondary | ICD-10-CM

## 2021-01-19 MED ORDER — AMPHETAMINE-DEXTROAMPHETAMINE 20 MG PO TABS: 20.0000 mg | ORAL_TABLET | Freq: Three times a day (TID) | ORAL | 0 refills | Status: DC

## 2021-02-28 ENCOUNTER — Ambulatory Visit (INDEPENDENT_AMBULATORY_CARE_PROVIDER_SITE_OTHER): Payer: Commercial Managed Care - PPO

## 2021-02-28 ENCOUNTER — Other Ambulatory Visit: Payer: Self-pay

## 2021-02-28 DIAGNOSIS — Z23 Encounter for immunization: Secondary | ICD-10-CM

## 2021-03-21 ENCOUNTER — Ambulatory Visit (INDEPENDENT_AMBULATORY_CARE_PROVIDER_SITE_OTHER): Payer: Commercial Managed Care - PPO | Admitting: Family Medicine

## 2021-03-21 ENCOUNTER — Other Ambulatory Visit: Payer: Self-pay

## 2021-03-21 ENCOUNTER — Encounter: Payer: Self-pay | Admitting: Family Medicine

## 2021-03-21 VITALS — BP 132/84 | HR 105 | Temp 98.1°F | Ht 74.0 in | Wt 302.4 lb

## 2021-03-21 DIAGNOSIS — G44019 Episodic cluster headache, not intractable: Secondary | ICD-10-CM

## 2021-03-21 DIAGNOSIS — Z0000 Encounter for general adult medical examination without abnormal findings: Secondary | ICD-10-CM | POA: Diagnosis not present

## 2021-03-21 DIAGNOSIS — Z833 Family history of diabetes mellitus: Secondary | ICD-10-CM

## 2021-03-21 DIAGNOSIS — F988 Other specified behavioral and emotional disorders with onset usually occurring in childhood and adolescence: Secondary | ICD-10-CM | POA: Diagnosis not present

## 2021-03-21 MED ORDER — AMPHETAMINE-DEXTROAMPHETAMINE 30 MG PO TABS
30.0000 mg | ORAL_TABLET | Freq: Three times a day (TID) | ORAL | 0 refills | Status: DC
Start: 2021-03-21 — End: 2021-04-19

## 2021-03-21 NOTE — Progress Notes (Signed)
   Subjective:    Patient ID: Gerald Savage, male    DOB: 09-Jul-1985, 35 y.o.   MRN: [id number]  HPI Is here for complete examination.  He does have underlying ADD and takes Adderall 20 mg 3 times per day on an empty stomach.  He does note that it kicks in and works within a half an hour but notes dosing in the afternoon does not seem to be as effective even though he is taking it on an empty stomach.  He also is in the process of making changes in his diet and exercise as he has seen his weight start to go back up.  He is now on a low-carb type program.  He has not had any headaches recently.  Otherwise he has no particular concerns or complaints.  He does not smoke or drink.  His marriage and work are going well.  Otherwise family and social history as well as health maintenance and immunizations was reviewed.   Review of Systems  All other systems reviewed and are negative.     Objective:   Physical Exam Alert and in no distress. Tympanic membranes and canals are normal. Pharyngeal area is normal. Neck is supple without adenopathy or thyromegaly. Cardiac exam shows a regular sinus rhythm without murmurs or gallops. Lungs are clear to auscultation. Abdominal exam showed no masses or tenderness.  Gerald Savage shows normal circumcised male.  Penis and testes normal.       Assessment & Plan:  Routine general medical examination at a health care facility - Plan: CBC with Differential/Platelet, Comprehensive metabolic panel, Lipid panel  Attention deficit disorder (ADD) without hyperactivity - Plan: amphetamine-dextroamphetamine (ADDERALL) 30 MG tablet  Obesity, Class III, BMI 40-49.9 (morbid obesity) (HCC) - Plan: CBC with Differential/Platelet, Comprehensive metabolic panel, Lipid panel  Family history of diabetes mellitus - Plan: Comprehensive metabolic panel  Episodic cluster headache, not intractable I will give him the next higher dose of Adderall.  He will keep me informed as to  whether it works better or not. Also encouraged him to continue with his diet and weight loss program.

## 2021-03-22 LAB — CBC WITH DIFFERENTIAL/PLATELET
Basophils Absolute: 0 10*3/uL (ref 0.0–0.2)
Basos: 1 %
EOS (ABSOLUTE): 0 10*3/uL (ref 0.0–0.4)
Eos: 1 %
Hematocrit: 44.9 % (ref 37.5–51.0)
Hemoglobin: 15.1 g/dL (ref 13.0–17.7)
Immature Grans (Abs): 0 10*3/uL (ref 0.0–0.1)
Immature Granulocytes: 0 %
Lymphocytes Absolute: 1.9 10*3/uL (ref 0.7–3.1)
Lymphs: 39 %
MCH: 29.1 pg (ref 26.6–33.0)
MCHC: 33.6 g/dL (ref 31.5–35.7)
MCV: 87 fL (ref 79–97)
Monocytes Absolute: 0.4 10*3/uL (ref 0.1–0.9)
Monocytes: 7 %
Neutrophils Absolute: 2.5 10*3/uL (ref 1.4–7.0)
Neutrophils: 52 %
Platelets: 303 10*3/uL (ref 150–450)
RBC: 5.19 x10E6/uL (ref 4.14–5.80)
RDW: 12.1 % (ref 11.6–15.4)
WBC: 4.9 10*3/uL (ref 3.4–10.8)

## 2021-03-22 LAB — LIPID PANEL
Chol/HDL Ratio: 2.5 ratio (ref 0.0–5.0)
Cholesterol, Total: 140 mg/dL (ref 100–199)
HDL: 55 mg/dL (ref >39)
LDL Chol Calc (NIH): 75 mg/dL (ref 0–99)
Triglycerides: 41 mg/dL (ref 0–149)
VLDL Cholesterol Cal: 10 mg/dL (ref 5–40)

## 2021-03-22 LAB — COMPREHENSIVE METABOLIC PANEL
ALT: 23 IU/L (ref 0–44)
AST: 27 IU/L (ref 0–40)
Albumin/Globulin Ratio: 1.7 (ref 1.2–2.2)
Albumin: 4.7 g/dL (ref 4.0–5.0)
Alkaline Phosphatase: 73 IU/L (ref 44–121)
BUN/Creatinine Ratio: 10 (ref 9–20)
BUN: 9 mg/dL (ref 6–20)
Bilirubin Total: 0.5 mg/dL (ref 0.0–1.2)
CO2: 25 mmol/L (ref 20–29)
Calcium: 9.4 mg/dL (ref 8.7–10.2)
Chloride: 104 mmol/L (ref 96–106)
Creatinine, Ser: 0.92 mg/dL (ref 0.76–1.27)
Globulin, Total: 2.8 g/dL (ref 1.5–4.5)
Glucose: 92 mg/dL (ref 70–99)
Potassium: 4.5 mmol/L (ref 3.5–5.2)
Sodium: 142 mmol/L (ref 134–144)
Total Protein: 7.5 g/dL (ref 6.0–8.5)
eGFR: 111 mL/min/{1.73_m2} (ref >59)

## 2021-04-19 ENCOUNTER — Other Ambulatory Visit: Payer: Self-pay | Admitting: Family Medicine

## 2021-04-19 DIAGNOSIS — F988 Other specified behavioral and emotional disorders with onset usually occurring in childhood and adolescence: Secondary | ICD-10-CM

## 2021-04-19 MED ORDER — AMPHETAMINE-DEXTROAMPHETAMINE 30 MG PO TABS: 30.0000 mg | ORAL_TABLET | Freq: Three times a day (TID) | ORAL | 0 refills | Status: DC

## 2021-04-19 MED ORDER — AMPHETAMINE-DEXTROAMPHETAMINE 30 MG PO TABS: 30.0000 mg | ORAL_TABLET | Freq: Every day | ORAL | 0 refills | Status: DC

## 2021-04-19 NOTE — Telephone Encounter (Signed)
[facility] is requesting to fill pt [facility]der[facility]. Pt reports the increase works well. Please advise [facility]

## 2021-05-02 ENCOUNTER — Encounter: Payer: Self-pay | Admitting: Family Medicine

## 2021-05-10 ENCOUNTER — Encounter: Payer: Self-pay | Admitting: Family Medicine

## 2021-05-10 MED ORDER — AMPHETAMINE-DEXTROAMPHETAMINE 20 MG PO TABS: 20.0000 mg | ORAL_TABLET | Freq: Three times a day (TID) | ORAL | 0 refills | Status: DC

## 2021-11-21 ENCOUNTER — Other Ambulatory Visit: Payer: Self-pay | Admitting: Family Medicine

## 2021-11-21 MED ORDER — AMPHETAMINE-DEXTROAMPHETAMINE 20 MG PO TABS: 20.0000 mg | ORAL_TABLET | Freq: Three times a day (TID) | ORAL | 0 refills | Status: DC

## 2021-11-21 MED ORDER — AMPHETAMINE-DEXTROAMPHETAMINE 20 MG PO TABS
20.0000 mg | ORAL_TABLET | Freq: Three times a day (TID) | ORAL | 0 refills | Status: DC
Start: 2022-01-21 — End: 2022-08-12

## 2021-11-21 NOTE — Telephone Encounter (Signed)
[facility] is requesting to fill pt adderall. Please advise [facility] 

## 2021-12-19 ENCOUNTER — Encounter: Payer: Self-pay | Admitting: Internal Medicine

## 2022-02-04 ENCOUNTER — Encounter: Payer: Self-pay | Admitting: Internal Medicine

## 2022-04-22 ENCOUNTER — Other Ambulatory Visit: Payer: Self-pay | Admitting: Family Medicine

## 2022-04-22 NOTE — Telephone Encounter (Signed)
Refill request last apt 03/21/21.

## 2022-04-23 NOTE — Telephone Encounter (Signed)
Called pt. Had to LM to schedule an apt.

## 2022-06-14 ENCOUNTER — Telehealth: Payer: Self-pay | Admitting: Family Medicine

## 2022-06-14 MED ORDER — AMPHETAMINE-DEXTROAMPHETAMINE 20 MG PO TABS: 20.0000 mg | ORAL_TABLET | Freq: Three times a day (TID) | ORAL | 0 refills | Status: DC

## 2022-06-14 NOTE — Telephone Encounter (Signed)
Pt called in and scheduled his physical and a med check. His most recent appointment will be 06/25/22 and he is requesting a refill on adderall until [facility]  [id number] [place], [place] - [address]

## 2022-06-25 ENCOUNTER — Encounter: Payer: Self-pay | Admitting: Family Medicine

## 2022-06-25 ENCOUNTER — Telehealth (INDEPENDENT_AMBULATORY_CARE_PROVIDER_SITE_OTHER): Payer: Commercial Managed Care - PPO | Admitting: Family Medicine

## 2022-06-25 VITALS — Wt 310.0 lb

## 2022-06-25 DIAGNOSIS — F988 Other specified behavioral and emotional disorders with onset usually occurring in childhood and adolescence: Secondary | ICD-10-CM | POA: Diagnosis not present

## 2022-06-25 MED ORDER — AMPHETAMINE-DEXTROAMPHETAMINE 20 MG PO TABS: 20.0000 mg | ORAL_TABLET | Freq: Three times a day (TID) | ORAL | 0 refills | Status: DC

## 2022-06-25 NOTE — Progress Notes (Signed)
   Subjective:    Patient ID: Gerald Savage, male    DOB: 03/28/1986, 37 y.o.   MRN: [id number]  HPI Documentation for virtual audio and video telecommunications through [place] encounter: The patient was located at home. 2 patient identifiers used.  The provider was located in the office. The patient did consent to this visit and is aware of possible charges through their insurance for this visit. The other persons participating in this telemedicine service were none. Time spent on call was 5 minutes and in review of previous records >20 minutes total for counseling and coordination of care. This virtual service is not related to other E/M service within previous 7 days.  Today's visit is to discuss ADHD.  He has a several year history of this and is taking Adderall 20 mg 3 times per day.  He states that each pill lasts roughly 3 hours and helps him stay focused.  He has no symptoms when he withdraws the medication.  He usually does not use it on the weekends.  He does have an appointment for complete exam in April.  Review of Systems     Objective:   Physical Exam Alert and in no distress otherwise not examined       Assessment & Plan:  Attention deficit disorder (ADD) without hyperactivity - Plan: amphetamine-dextroamphetamine (ADDERALL) 20 MG tablet I will continue on his present medication regimen.  We discussed the possibility of using a longer acting medication and we can rediscuss this when he comes in.  Discussed the fact that it is also more expensive

## 2022-07-03 ENCOUNTER — Encounter: Payer: Self-pay | Admitting: Family Medicine

## 2022-08-12 ENCOUNTER — Other Ambulatory Visit: Payer: Self-pay | Admitting: Family Medicine

## 2022-08-12 DIAGNOSIS — F988 Other specified behavioral and emotional disorders with onset usually occurring in childhood and adolescence: Secondary | ICD-10-CM

## 2022-08-12 MED ORDER — AMPHETAMINE-DEXTROAMPHETAMINE 20 MG PO TABS: 20.0000 mg | ORAL_TABLET | Freq: Three times a day (TID) | ORAL | 0 refills | Status: DC

## 2022-08-12 NOTE — Telephone Encounter (Signed)
Refill request last apt 03/21/21 next apt 08/21/22.

## 2022-08-21 ENCOUNTER — Encounter: Payer: Self-pay | Admitting: Family Medicine

## 2022-08-21 ENCOUNTER — Ambulatory Visit (INDEPENDENT_AMBULATORY_CARE_PROVIDER_SITE_OTHER): Payer: Commercial Managed Care - PPO | Admitting: Family Medicine

## 2022-08-21 VITALS — BP 144/100 | HR 90 | Temp 98.3°F | Resp 16 | Ht 74.0 in | Wt 324.0 lb

## 2022-08-21 DIAGNOSIS — G44019 Episodic cluster headache, not intractable: Secondary | ICD-10-CM | POA: Diagnosis not present

## 2022-08-21 DIAGNOSIS — J301 Allergic rhinitis due to pollen: Secondary | ICD-10-CM

## 2022-08-21 DIAGNOSIS — Z833 Family history of diabetes mellitus: Secondary | ICD-10-CM

## 2022-08-21 DIAGNOSIS — Z0000 Encounter for general adult medical examination without abnormal findings: Secondary | ICD-10-CM

## 2022-08-21 DIAGNOSIS — N500 Atrophy of testis: Secondary | ICD-10-CM

## 2022-08-21 DIAGNOSIS — F988 Other specified behavioral and emotional disorders with onset usually occurring in childhood and adolescence: Secondary | ICD-10-CM

## 2022-08-21 LAB — POCT URINALYSIS DIP (PROADVANTAGE DEVICE)
Blood, UA: NEGATIVE
Glucose, UA: NEGATIVE mg/dL
Leukocytes, UA: NEGATIVE
Nitrite, UA: NEGATIVE
Specific Gravity, Urine: 1.02
Urobilinogen, Ur: 0.2
pH, UA: 6 (ref 5.0–8.0)

## 2022-08-21 NOTE — Progress Notes (Signed)
Complete physical exam  Patient: Gerald Savage   DOB: 1986-02-15   37 y.o. Male  MRN: [id number]  Subjective:    Chief Complaint  Patient presents with   Annual Exam    Fasting x 6 hours.     Gerald Savage is a 37 y.o. male who presents today for a complete physical exam. He reports consuming a general diet. Gym/ health club routine includes basketball and cardio. He generally feels fairly well. He reports sleeping fairly well. He now recognizes the need to make major lifestyle changes in regard to diet and exercise.  His work seems to be going quite well.  He has a 3-year-old daughter.  His marriage is going well they are considering having another child.  His cluster headaches seem to be doing well and he thinks they are related to springtime allergies.  He continues on his ADD medication.  Each pill lasts roughly 4 hours and sometimes he only takes 2/day. We also discussed stress in regard to home and work.  He now recognizes that he needs to do a better job of taking care of himself as well as other people's needs and control only what he can control. Otherwise his family and social history as well as health maintenance and immunizations was reviewed. Most recent fall risk assessment:    06/25/2022    3:50 PM  Fall Risk   Falls in the past year? 0  Number falls in past yr: 0  Injury with Fall? 0  Risk for fall due to : No Fall Risks  Follow up Falls evaluation completed     Most recent depression screenings:    08/21/2022    2:39 PM 06/25/2022    3:50 PM  PHQ 2/9 Scores  PHQ - 2 Score 4 0  PHQ- 9 Score 4     Vision:Not within last year  and Dental: Receives regular dental care    Patient Care Team: Ronnald Nian, MD as PCP - General (Family Medicine)   Outpatient Medications Prior to Visit  Medication Sig   amphetamine-dextroamphetamine (ADDERALL) 20 MG tablet Take 1 tablet (20 mg total) by mouth 3 (three) times daily.   No facility-administered  medications prior to visit.    Review of Systems  All other systems reviewed and are negative.         Objective:     BP (!) 144/100 (BP Location: Left Arm, Cuff Size: Large)   Pulse 90   Temp 98.3 F (36.8 C) (Oral)   Resp 16   Ht 6\' 2"  (1.88 m)   Wt (!) 324 lb (147 kg)   SpO2 97% Comment: room air  BMI 41.60 kg/m    Physical Exam   Alert and in no distress. Tympanic membranes and canals are normal. Pharyngeal area is normal. Neck is supple without adenopathy or thyromegaly. Cardiac exam shows a regular sinus rhythm without murmurs or gallops. Lungs are clear to auscultation.  Abdominal exam shows no masses or tenderness.  Genitalia shows right testicular atrophy otherwise normal.     Assessment & Plan:    Routine general medical examination at a health care facility - Plan: CBC with Differential/Platelet, Comprehensive metabolic panel, Lipid panel, POCT Urinalysis DIP (Proadvantage Device)  Episodic cluster headache, not intractable  Obesity, Class III, BMI 40-49.9 (morbid obesity) (HCC)  Family history of diabetes mellitus  Attention deficit disorder (ADD) without hyperactivity  Testicular atrophy - Right side  Seasonal allergic rhinitis due to pollen  Morbid obesity (HCC)  Immunization History  Administered Date(s) Administered   HIB (PRP-OMP) 03/21/1986, 05/23/1986, 08/01/1986, 08/14/1987, 08/15/1990   IPV 02/24/1986, 05/23/1986, 08/01/1986, 08/14/1987, 08/15/1990, 08/14/1996   Influenza,inj,Quad PF,6+ Mos 12/04/2016, 02/17/2018, 02/22/2019, 03/01/2020, 02/28/2021   Influenza-Unspecified 02/17/2018   MMR 04/24/1987, 09/15/2003   PFIZER(Purple Top)SARS-COV-2 Vaccination 07/03/2019, 07/24/2019, 03/01/2020   PPD Test 09/19/2003   Pfizer Covid-19 Vaccine Bivalent Booster 60yrs & up 02/28/2021   Tdap 07/14/2014, 12/04/2016    Health Maintenance  Topic Date Due   HIV Screening  Never done   COVID-19 Vaccine (5 - 2023-24 season) 12/14/2021   INFLUENZA  VACCINE  11/14/2022   DTaP/Tdap/Td (3 - Td or Tdap) 12/05/2026   Hepatitis C Screening  Completed   HPV VACCINES  Aged Out    Discussed health benefits of physical activity, and encouraged him to engage in regular exercise appropriate for his age and condition.  Discussed dietary modification especially in regard to carbohydrates.  Encouraged him to make this a permanent part of his life.  His Adderall will be renewed on as-needed basis.  He is doing well on that. Then discussed some of the psychological components of what is going on in his life in terms of better balance between taking care of himself, his child, his wife and work.  Problem List Items Addressed This Visit       Nervous and Auditory   Cluster headaches     Other   Attention deficit disorder (ADD) without hyperactivity   Family history of diabetes mellitus   Obesity, Class III, BMI 40-49.9 (morbid obesity) (HCC)   Other Visit Diagnoses     Routine general medical examination at a health care facility    -  Primary      No follow-ups on file.     Benjiman Core, CMA

## 2022-08-22 LAB — CBC WITH DIFFERENTIAL/PLATELET
Basophils Absolute: 0 10*3/uL (ref 0.0–0.2)
Basos: 0 %
EOS (ABSOLUTE): 0 10*3/uL (ref 0.0–0.4)
Eos: 1 %
Hematocrit: 46.2 % (ref 37.5–51.0)
Hemoglobin: 15.5 g/dL (ref 13.0–17.7)
Immature Grans (Abs): 0 10*3/uL (ref 0.0–0.1)
Immature Granulocytes: 0 %
Lymphocytes Absolute: 2 10*3/uL (ref 0.7–3.1)
Lymphs: 38 %
MCH: 29.5 pg (ref 26.6–33.0)
MCHC: 33.5 g/dL (ref 31.5–35.7)
MCV: 88 fL (ref 79–97)
Monocytes Absolute: 0.3 10*3/uL (ref 0.1–0.9)
Monocytes: 5 %
Neutrophils Absolute: 2.9 10*3/uL (ref 1.4–7.0)
Neutrophils: 56 %
Platelets: 305 10*3/uL (ref 150–450)
RBC: 5.25 x10E6/uL (ref 4.14–5.80)
RDW: 12.9 % (ref 11.6–15.4)
WBC: 5.2 10*3/uL (ref 3.4–10.8)

## 2022-08-22 LAB — COMPREHENSIVE METABOLIC PANEL
ALT: 33 IU/L (ref 0–44)
AST: 33 IU/L (ref 0–40)
Albumin/Globulin Ratio: 1.6 (ref 1.2–2.2)
Albumin: 4.6 g/dL (ref 4.1–5.1)
Alkaline Phosphatase: 86 IU/L (ref 44–121)
BUN/Creatinine Ratio: 13 (ref 9–20)
BUN: 11 mg/dL (ref 6–20)
Bilirubin Total: 0.7 mg/dL (ref 0.0–1.2)
CO2: 24 mmol/L (ref 20–29)
Calcium: 9.6 mg/dL (ref 8.7–10.2)
Chloride: 104 mmol/L (ref 96–106)
Creatinine, Ser: 0.88 mg/dL (ref 0.76–1.27)
Globulin, Total: 2.9 g/dL (ref 1.5–4.5)
Glucose: 90 mg/dL (ref 70–99)
Potassium: 4.5 mmol/L (ref 3.5–5.2)
Sodium: 140 mmol/L (ref 134–144)
Total Protein: 7.5 g/dL (ref 6.0–8.5)
eGFR: 114 mL/min/{1.73_m2} (ref >59)

## 2022-08-22 LAB — LIPID PANEL
Chol/HDL Ratio: 2.6 ratio (ref 0.0–5.0)
Cholesterol, Total: 143 mg/dL (ref 100–199)
HDL: 56 mg/dL (ref >39)
LDL Chol Calc (NIH): 76 mg/dL (ref 0–99)
Triglycerides: 50 mg/dL (ref 0–149)
VLDL Cholesterol Cal: 11 mg/dL (ref 5–40)

## 2022-11-12 ENCOUNTER — Other Ambulatory Visit: Payer: Self-pay | Admitting: Family Medicine

## 2022-11-12 DIAGNOSIS — F988 Other specified behavioral and emotional disorders with onset usually occurring in childhood and adolescence: Secondary | ICD-10-CM

## 2022-11-13 MED ORDER — AMPHETAMINE-DEXTROAMPHETAMINE 20 MG PO TABS: 20.0000 mg | ORAL_TABLET | Freq: Three times a day (TID) | ORAL | 0 refills | Status: DC

## 2023-02-18 ENCOUNTER — Encounter: Payer: Self-pay | Admitting: Family Medicine

## 2023-02-18 ENCOUNTER — Ambulatory Visit (INDEPENDENT_AMBULATORY_CARE_PROVIDER_SITE_OTHER): Payer: Commercial Managed Care - PPO | Admitting: Family Medicine

## 2023-02-18 VITALS — BP 138/88 | HR 84 | Ht 75.0 in | Wt 333.8 lb

## 2023-02-18 DIAGNOSIS — J301 Allergic rhinitis due to pollen: Secondary | ICD-10-CM | POA: Diagnosis not present

## 2023-02-18 DIAGNOSIS — F988 Other specified behavioral and emotional disorders with onset usually occurring in childhood and adolescence: Secondary | ICD-10-CM

## 2023-02-18 DIAGNOSIS — G44009 Cluster headache syndrome, unspecified, not intractable: Secondary | ICD-10-CM | POA: Diagnosis not present

## 2023-02-18 DIAGNOSIS — Z0000 Encounter for general adult medical examination without abnormal findings: Secondary | ICD-10-CM

## 2023-02-18 DIAGNOSIS — Z833 Family history of diabetes mellitus: Secondary | ICD-10-CM

## 2023-02-18 DIAGNOSIS — Z23 Encounter for immunization: Secondary | ICD-10-CM | POA: Diagnosis not present

## 2023-02-18 MED ORDER — AMPHETAMINE-DEXTROAMPHETAMINE 20 MG PO TABS: 20.0000 mg | ORAL_TABLET | Freq: Three times a day (TID) | ORAL | 0 refills | Status: DC

## 2023-02-18 NOTE — Progress Notes (Signed)
   Subjective:    Patient ID: Gerald Savage, male    DOB: 06-21-1985, 37 y.o.   MRN: [id number]  HPI He is here for an interval evaluation.  He has struggled with his weight for several years with intermittent success.  Presently he has tried to increase his physical activity and continues to try and make dietary adjustments in terms of carbohydrates.  There is a family history of diabetes.  He has no other concerns or complaints.  He also has ADD and would like a refill on his medication.  He seems to do fairly well on this and does recognize that while he is on it he does have some anorexia but not enough to make much of a difference.  He has a history of cluster headaches but has not had any of these recently.  His allergies seem to be under good control.   Review of Systems     Objective:    Physical Exam Alert and in no distress. Tympanic membranes and canals are normal. Pharyngeal area is normal. Neck is supple without adenopathy or thyromegaly. Cardiac exam shows a regular sinus rhythm without murmurs or gallops. Lungs are clear to auscultation.        Assessment & Plan:  Attention deficit disorder (ADD) without hyperactivity - Plan: amphetamine-dextroamphetamine (ADDERALL) 20 MG tablet  Cluster headache, not intractable, unspecified chronicity pattern  Obesity, Class III, BMI 40-49.9 (morbid obesity) (HCC)  Seasonal allergic rhinitis due to pollen  Family history of diabetes mellitus  Need for influenza vaccination - Plan: Flu vaccine trivalent PF, 6mos and older(Flulaval,Afluria,Fluarix,Fluzone)  I discussed weight with him in detail and recommend he call his insurance company and see if they will cover Wegovy/Zepbound and let me know.

## 2023-02-18 NOTE — Patient Instructions (Signed)
Check with your insurance on coverage for Zepbound or [facility]

## 2023-06-10 ENCOUNTER — Encounter: Payer: Self-pay | Admitting: Internal Medicine

## 2023-06-26 ENCOUNTER — Other Ambulatory Visit: Payer: Self-pay | Admitting: Family Medicine

## 2023-06-26 DIAGNOSIS — F988 Other specified behavioral and emotional disorders with onset usually occurring in childhood and adolescence: Secondary | ICD-10-CM

## 2023-06-26 MED ORDER — AMPHETAMINE-DEXTROAMPHETAMINE 20 MG PO TABS: 20.0000 mg | ORAL_TABLET | Freq: Three times a day (TID) | ORAL | 0 refills | Status: DC

## 2023-06-27 ENCOUNTER — Other Ambulatory Visit (HOSPITAL_COMMUNITY): Payer: Self-pay

## 2023-06-27 ENCOUNTER — Telehealth: Payer: Self-pay

## 2023-06-27 NOTE — Telephone Encounter (Signed)
 Pharmacy Patient Advocate Encounter  Received notification from [facility] that Prior Authorization for Amphetamine-Dextroamphetamine 20MG  tablets has been APPROVED from 3.14.25 to 3.13.28. Ran test claim, Copay is $16.71. This test claim was processed through [facility]- copay amounts may vary at other pharmacies due to pharmacy/plan contracts, or as the patient moves through the different stages of their insurance plan.   PA #/Case ID/Reference #: (Key: [id number])

## 2023-09-19 ENCOUNTER — Encounter: Payer: Self-pay | Admitting: Family Medicine

## 2023-09-19 DIAGNOSIS — F988 Other specified behavioral and emotional disorders with onset usually occurring in childhood and adolescence: Secondary | ICD-10-CM

## 2023-09-20 MED ORDER — AMPHETAMINE-DEXTROAMPHETAMINE 20 MG PO TABS: 20.0000 mg | ORAL_TABLET | Freq: Three times a day (TID) | ORAL | 0 refills | Status: DC

## 2024-01-02 ENCOUNTER — Encounter: Payer: Self-pay | Admitting: Family Medicine

## 2024-01-02 DIAGNOSIS — F988 Other specified behavioral and emotional disorders with onset usually occurring in childhood and adolescence: Secondary | ICD-10-CM

## 2024-01-03 MED ORDER — AMPHETAMINE-DEXTROAMPHETAMINE 20 MG PO TABS: 20.0000 mg | ORAL_TABLET | Freq: Three times a day (TID) | ORAL | 0 refills | Status: DC

## 2024-02-16 ENCOUNTER — Encounter: Payer: Self-pay | Admitting: Family Medicine

## 2024-02-17 NOTE — Telephone Encounter (Signed)
 Rescheduled cpe for Dr. Joyce, called pt lmtrc and sent my chart.

## 2024-03-30 ENCOUNTER — Ambulatory Visit: Admitting: Family Medicine

## 2024-03-30 ENCOUNTER — Encounter: Payer: Self-pay | Admitting: Family Medicine

## 2024-03-30 VITALS — BP 136/90 | HR 77 | Ht 75.0 in | Wt 355.0 lb

## 2024-03-30 DIAGNOSIS — N500 Atrophy of testis: Secondary | ICD-10-CM

## 2024-03-30 DIAGNOSIS — Z23 Encounter for immunization: Secondary | ICD-10-CM

## 2024-03-30 DIAGNOSIS — F988 Other specified behavioral and emotional disorders with onset usually occurring in childhood and adolescence: Secondary | ICD-10-CM | POA: Diagnosis not present

## 2024-03-30 DIAGNOSIS — G44019 Episodic cluster headache, not intractable: Secondary | ICD-10-CM | POA: Diagnosis not present

## 2024-03-30 DIAGNOSIS — Z833 Family history of diabetes mellitus: Secondary | ICD-10-CM | POA: Diagnosis not present

## 2024-03-30 DIAGNOSIS — R030 Elevated blood-pressure reading, without diagnosis of hypertension: Secondary | ICD-10-CM | POA: Insufficient documentation

## 2024-03-30 DIAGNOSIS — E66813 Obesity, class 3: Secondary | ICD-10-CM

## 2024-03-30 DIAGNOSIS — Z0000 Encounter for general adult medical examination without abnormal findings: Secondary | ICD-10-CM | POA: Diagnosis not present

## 2024-03-30 DIAGNOSIS — J301 Allergic rhinitis due to pollen: Secondary | ICD-10-CM | POA: Diagnosis not present

## 2024-03-30 LAB — CBC WITH DIFFERENTIAL/PLATELET
Basophils Absolute: 0 x10E3/uL (ref 0.0–0.2)
Basos: 1 %
EOS (ABSOLUTE): 0.1 x10E3/uL (ref 0.0–0.4)
Eos: 1 %
Hematocrit: 46.2 % (ref 37.5–51.0)
Hemoglobin: 15.1 g/dL (ref 13.0–17.7)
Immature Grans (Abs): 0 x10E3/uL (ref 0.0–0.1)
Immature Granulocytes: 0 %
Lymphocytes Absolute: 2.3 x10E3/uL (ref 0.7–3.1)
Lymphs: 35 %
MCH: 29.7 pg (ref 26.6–33.0)
MCHC: 32.7 g/dL (ref 31.5–35.7)
MCV: 91 fL (ref 79–97)
Monocytes Absolute: 0.4 x10E3/uL (ref 0.1–0.9)
Monocytes: 7 %
Neutrophils Absolute: 3.7 x10E3/uL (ref 1.4–7.0)
Neutrophils: 56 %
Platelets: 318 x10E3/uL (ref 150–450)
RBC: 5.08 x10E6/uL (ref 4.14–5.80)
RDW: 13.1 % (ref 11.6–15.4)
WBC: 6.5 x10E3/uL (ref 3.4–10.8)

## 2024-03-30 LAB — LIPID PANEL
Chol/HDL Ratio: 2.8 ratio (ref 0.0–5.0)
Cholesterol, Total: 148 mg/dL (ref 100–199)
HDL: 52 mg/dL (ref >39)
LDL Chol Calc (NIH): 83 mg/dL (ref 0–99)
Triglycerides: 67 mg/dL (ref 0–149)
VLDL Cholesterol Cal: 13 mg/dL (ref 5–40)

## 2024-03-30 LAB — COMPREHENSIVE METABOLIC PANEL WITH GFR
ALT: 45 IU/L — ABNORMAL HIGH (ref 0–44)
AST: 45 IU/L — ABNORMAL HIGH (ref 0–40)
Albumin: 4.2 g/dL (ref 4.1–5.1)
Alkaline Phosphatase: 106 IU/L (ref 47–123)
BUN/Creatinine Ratio: 11 (ref 9–20)
BUN: 9 mg/dL (ref 6–20)
Bilirubin Total: 0.5 mg/dL (ref 0.0–1.2)
CO2: 22 mmol/L (ref 20–29)
Calcium: 9.6 mg/dL (ref 8.7–10.2)
Chloride: 102 mmol/L (ref 96–106)
Creatinine, Ser: 0.84 mg/dL (ref 0.76–1.27)
Globulin, Total: 2.7 g/dL (ref 1.5–4.5)
Glucose: 94 mg/dL (ref 70–99)
Potassium: 3.7 mmol/L (ref 3.5–5.2)
Sodium: 138 mmol/L (ref 134–144)
Total Protein: 6.9 g/dL (ref 6.0–8.5)
eGFR: 114 mL/min/1.73 (ref >59)

## 2024-03-30 MED ORDER — ZEPBOUND 2.5 MG/0.5ML ~~LOC~~ SOAJ: 2.5000 mg | SUBCUTANEOUS | 1 refills | Status: DC

## 2024-03-30 MED ORDER — AMPHETAMINE-DEXTROAMPHETAMINE 20 MG PO TABS: 20.0000 mg | ORAL_TABLET | Freq: Three times a day (TID) | ORAL | 0 refills | Status: AC

## 2024-03-30 NOTE — Progress Notes (Signed)
 Complete physical exam  Patient: Gerald Savage   DOB: 12/15/1985   38 y.o. Male  MRN: [id number]  Subjective:    Chief Complaint  Patient presents with   Annual Exam    Fasting    Gerald Savage is a 38 y.o. male who presents today for a complete physical exam.  He reports consuming a general diet. The patient does not participate in regular exercise at present. He generally feels fairly well. He reports sleeping fairly well. Discussed the use of AI scribe software for clinical note transcription with the patient, who gave verbal consent to proceed.  He has previously attempted intermittent fasting, low-carb diets, and exercise. Seven years ago, he successfully lost a significant amount of weight by increasing his activity level and participating in 5K runs, but he has since regained weight due to lifestyle changes, including career demands and parenting responsibilities. He is considering new medications for weight loss, noting that his insurance covers options like Zepbound .  He has a history of cluster headaches but reports no recent occurrences. He describes headaches with pressure behind the eye, which are alleviated by using a nasal spray for his seasonal allergies.  He has been diagnosed with ADD and is prescribed medication to be taken three times a day, though he often takes it twice a day and skips doses on weekends. He last refilled his prescription recently and stretches out the medication use.  His blood pressure readings have been slightly elevated, with a recent measurement of 136/90. Previous readings include 138/88 last year. He is not currently on medication for hypertension.  He experiences seasonal allergies, primarily in the spring, for which he uses a nasal spray. He is married, has a seven-year-old daughter, and works as a firefighter. No smoking or regular alcohol use.      Most recent fall risk assessment:    03/30/2024    9:17 AM  Fall  Risk   Falls in the past year? 0  Number falls in past yr: 0  Injury with Fall? 0  Risk for fall due to : No Fall Risks  Follow up Falls evaluation completed     Most recent depression screenings:    03/30/2024    9:17 AM 08/21/2022    2:39 PM  PHQ 2/9 Scores  PHQ - 2 Score 0 4  PHQ- 9 Score  4      Data saved with a previous flowsheet row definition    Vision:Not within last year  2024 Eye Med Dentist: [facility] 12/24     Immunization History  Administered Date(s) Administered   HIB (PRP-OMP) 03/21/1986, 05/23/1986, 08/01/1986, 08/14/1987, 08/15/1990   IPV 02/24/1986, 05/23/1986, 08/01/1986, 08/14/1987, 08/15/1990, 08/14/1996   Influenza, Seasonal, Injecte, Preservative Fre 02/18/2023, 03/30/2024   Influenza,inj,Quad PF,6+ Mos 12/04/2016, 02/17/2018, 02/22/2019, 03/01/2020, 02/28/2021   Influenza-Unspecified 02/17/2018   MMR 04/24/1987, 09/15/2003   PFIZER(Purple Top)SARS-COV-2 Vaccination 07/03/2019, 07/24/2019, 03/01/2020   PPD Test 09/19/2003   Pfizer Covid-19 Vaccine Bivalent Booster 69yrs & up 02/28/2021   Tdap 07/14/2014, 12/04/2016    Health Maintenance  Topic Date Due   HIV Screening  Never done   Hepatitis B Vaccines 19-59 Average Risk (1 of 3 - 19+ 3-dose series) Never done   HPV VACCINES (1 - 3-dose SCDM series) Never done   COVID-19 Vaccine (5 - 2025-26 season) 04/15/2024 (Originally 12/15/2023)   DTaP/Tdap/Td (3 - Td or Tdap) 12/05/2026   Influenza Vaccine  Completed   Hepatitis C Screening  Completed  Pneumococcal Vaccine  Aged Out   Meningococcal B Vaccine  Aged Out    Patient Care Team: Joyce Norleen BROCKS, MD as PCP - General (Family Medicine)   Show/hide medication list[1]  Review of Systems  All other systems reviewed and are negative.   Family and social history as well as health maintenance and immunizations was reviewed.     Objective:      Physical Exam  Alert and in no distress. Tympanic membranes and canals are normal.  Pharyngeal area is normal. Neck is supple without adenopathy or thyromegaly. Cardiac exam shows a regular sinus rhythm without murmurs or gallops. Lungs are clear to auscultation.  Abdomen exam shows no masses or tenderness.      Assessment & Plan:    Discussed health benefits of physical activity, and encouraged him to engage in regular exercise appropriate for his age and condition.  Routine general medical examination at a health care facility - Plan: CBC with Differential/Platelet, Comprehensive metabolic panel with GFR, Lipid panel Family history of diabetes mellitus - Plan: Comprehensive metabolic panel with GFR Need for influenza vaccination - Plan: Flu vaccine trivalent PF, 6mos and older(Flulaval,Afluria,Fluarix,Fluzone)  Elevated blood pressure reading      Obesity, class 3 Chronic obesity with lifestyle challenges. Previous weight loss attempts were not sustained. Interested in GLP-1 receptor agonists, specifically Zepbound , covered by insurance. Discussed side effects and importance of diet and exercise. Emphasized long-term commitment for sustained weight loss and health benefits. - Initiated Zepbound  with gradual dose increase based on tolerance and side effects.  He will check with his insurance to make sure that this is a covered medication. - Scheduled follow-up in 3-4 months to assess weight loss progress. - Encouraged continued diet and exercise regimen. - Advised to report weight loss progress via MyChart.  Elevated blood pressure Blood pressure at 136/90 mmHg, slightly elevated. Trend of increasing blood pressure over the past year. Discussed potential for weight loss to reduce blood pressure. Decision to monitor changes with weight loss rather than starting antihypertensive medication. - Monitor blood pressure changes with weight loss. - Reassess blood pressure in follow-up visit.  Attention deficit disorder without hyperactivity Managed with Adderall, taken twice  daily instead of prescribed three times. Prefers to avoid medication on weekends, adjusts dosage based on workday needs. - Continue current Adderall regimen as needed, avoiding weekends. - Refilled Adderall prescription.  Episodic cluster headache Discussed previous management strategies including oxygen therapy and high-dose propranolol. - Advised to report any recurrence of cluster headaches via MyChart.  Allergic rhinitis due to pollen Symptoms primarily in spring. Managed with nasal spray, which also helps with headaches. - Continue nasal spray as needed for symptom relief.  General health maintenance Discussed importance of healthy lifestyle for weight and overall health. Encouraged staying active and engaged, especially with upcoming retirement. - Encouraged regular exercise and healthy diet. - Advised to stay socially and intellectually engaged.          Norleen Joyce, MD     [1]  Outpatient Medications Prior to Visit  Medication Sig   [DISCONTINUED] amphetamine -dextroamphetamine  (ADDERALL) 20 MG tablet Take 1 tablet (20 mg total) by mouth 3 (three) times daily.   No facility-administered medications prior to visit.

## 2024-03-31 ENCOUNTER — Ambulatory Visit: Payer: Self-pay | Admitting: Family Medicine

## 2024-03-31 NOTE — Progress Notes (Signed)
 Called Pt and went over results.

## 2024-04-06 ENCOUNTER — Encounter: Payer: Commercial Managed Care - PPO | Admitting: Family Medicine

## 2024-04-14 ENCOUNTER — Telehealth: Payer: Self-pay

## 2024-04-14 ENCOUNTER — Other Ambulatory Visit (HOSPITAL_COMMUNITY): Payer: Self-pay

## 2024-04-14 NOTE — Telephone Encounter (Signed)
"  error  "

## 2024-04-14 NOTE — Telephone Encounter (Signed)
 Pharmacy Patient Advocate Encounter   Received notification from Physician's Office that prior authorization for Zepbound  2.5 mg/0.5 ml pen is required/requested.   Insurance verification completed.   The patient is insured through [facility] AD[employer].   Per test claim: Per test claim, medication is not covered due to plan/benefit exclusion, PA not submitted at this time  +MUST USE ORLISTAT, QSYMIA, SAXENDA, WEGOVY.

## 2024-04-19 ENCOUNTER — Encounter: Payer: Self-pay | Admitting: Family Medicine

## 2024-04-19 DIAGNOSIS — E66813 Obesity, class 3: Secondary | ICD-10-CM

## 2024-04-20 MED ORDER — WEGOVY 0.25 MG/0.5ML ~~LOC~~ SOAJ: 0.2500 mg | SUBCUTANEOUS | 1 refills | Status: AC

## 2024-04-27 ENCOUNTER — Telehealth: Payer: Self-pay

## 2024-04-27 NOTE — Telephone Encounter (Signed)
 Error

## 2024-05-10 ENCOUNTER — Encounter: Payer: Self-pay | Admitting: Family Medicine
